# Patient Record
Sex: Female | Born: 1985 | Race: White | Hispanic: Yes | Marital: Married | Smoking: Former smoker
Health system: Southern US, Community
[De-identification: ages and names within clinical notes are randomized; demographics above are authoritative.]

## PROBLEM LIST (undated history)

## (undated) DIAGNOSIS — L309 Dermatitis, unspecified: Secondary | ICD-10-CM

## (undated) DIAGNOSIS — E282 Polycystic ovarian syndrome: Secondary | ICD-10-CM

## (undated) DIAGNOSIS — F329 Major depressive disorder, single episode, unspecified: Secondary | ICD-10-CM

## (undated) DIAGNOSIS — J45909 Unspecified asthma, uncomplicated: Secondary | ICD-10-CM

## (undated) DIAGNOSIS — F32A Depression, unspecified: Secondary | ICD-10-CM

## (undated) DIAGNOSIS — N3010 Interstitial cystitis (chronic) without hematuria: Secondary | ICD-10-CM

## (undated) DIAGNOSIS — F419 Anxiety disorder, unspecified: Secondary | ICD-10-CM

## (undated) HISTORY — DX: Dermatitis, unspecified: L30.9

## (undated) HISTORY — DX: Major depressive disorder, single episode, unspecified: F32.9

## (undated) HISTORY — DX: Depression, unspecified: F32.A

## (undated) HISTORY — DX: Interstitial cystitis (chronic) without hematuria: N30.10

## (undated) HISTORY — PX: WISDOM TOOTH EXTRACTION: SHX21

## (undated) HISTORY — DX: Polycystic ovarian syndrome: E28.2

## (undated) HISTORY — DX: Unspecified asthma, uncomplicated: J45.909

## (undated) HISTORY — DX: Anxiety disorder, unspecified: F41.9

---
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
## 2010-04-10 ENCOUNTER — Ambulatory Visit: Payer: Self-pay | Admitting: Unknown Physician Specialty

## 2012-02-18 DIAGNOSIS — F329 Major depressive disorder, single episode, unspecified: Secondary | ICD-10-CM | POA: Insufficient documentation

## 2013-02-07 ENCOUNTER — Other Ambulatory Visit: Payer: Self-pay | Admitting: Otolaryngology

## 2013-02-09 ENCOUNTER — Ambulatory Visit: Payer: Self-pay | Admitting: Otolaryngology

## 2013-02-24 ENCOUNTER — Ambulatory Visit: Payer: Self-pay | Admitting: Otolaryngology

## 2013-02-24 HISTORY — PX: OTHER SURGICAL HISTORY: SHX169

## 2013-02-25 LAB — PATHOLOGY REPORT

## 2015-02-26 DIAGNOSIS — L309 Dermatitis, unspecified: Secondary | ICD-10-CM | POA: Insufficient documentation

## 2015-02-26 DIAGNOSIS — E282 Polycystic ovarian syndrome: Secondary | ICD-10-CM | POA: Insufficient documentation

## 2015-02-26 DIAGNOSIS — J4520 Mild intermittent asthma, uncomplicated: Secondary | ICD-10-CM | POA: Insufficient documentation

## 2015-02-26 DIAGNOSIS — F419 Anxiety disorder, unspecified: Secondary | ICD-10-CM | POA: Insufficient documentation

## 2015-02-26 DIAGNOSIS — N390 Urinary tract infection, site not specified: Secondary | ICD-10-CM | POA: Insufficient documentation

## 2015-02-26 DIAGNOSIS — J309 Allergic rhinitis, unspecified: Secondary | ICD-10-CM | POA: Insufficient documentation

## 2015-03-30 NOTE — Op Note (Signed)
PATIENT NAME:  Kaitlin [name], Kaitlin [name] MR#:  [id number] DATE OF BIRTH:  September 18, 1986  DATE OF PROCEDURE:  02/25/2013  PREOPERATIVE DIAGNOSIS: Cervical lymphadenopathy with radiographically and clinically enlarged lymph nodes in the submental triangle (with concern for lymphoma).   POSTOPERATIVE DIAGNOSIS: Cervical lymphadenopathy with radiographically and clinically enlarged lymph nodes in the submental triangle (with concern for lymphoma).   PROCEDURE:  Excision, deep cervical lymph nodes (submental).   DESCRIPTION OF PROCEDURE:  The patient was placed in the supine position on the operating room table. After general LMA anesthesia had been induced, the patient was placed on a shoulder roll and neck extended. A horizontal skin crease was then chosen and locally anesthetized, prepped and draped in the usual fashion. A 15-blade was used to make an incision through the skin, and meticulous dissection was carried out down to the submental lymph nodes. Care was taken to avoid injury to the marginal mandibular nerves and the hypoglossal nerves bilaterally. Two large approximately 1.5 x 1.5 cm lymph nodes were taken, each from just off of the parasagittal midline. Meticulous hemostasis was achieved. The wound was closed over a TLS-type drain, dressed with bacitracin. The patient was returned to anesthesia, allowed to emerge from anesthesia in the operating room, and taken to the recovery room in stable condition. There were no complications.   ESTIMATED BLOOD LOSS: Approximately 10 mL.    ____________________________ J. Gertie BaronMadison Braxtyn Bojarski, MD jmc:dmm D: 02/25/2013 08:33:32 ET T: 02/25/2013 09:41:19 ET JOB#: [id number]  cc: Kaitlin BarefootJ. Kaitlin Quinn Quam, MD, <Dictator> Kaitlin CoppJMADISON Keonta Alsip MD ELECTRONICALLY SIGNED 02/28/2013 7:48

## 2016-07-18 ENCOUNTER — Other Ambulatory Visit: Payer: Self-pay | Admitting: Obstetrics and Gynecology

## 2016-07-18 DIAGNOSIS — N979 Female infertility, unspecified: Secondary | ICD-10-CM

## 2016-07-24 ENCOUNTER — Ambulatory Visit
Admission: RE | Admit: 2016-07-24 | Discharge: 2016-07-24 | Disposition: A | Discharge status: Home / Self Care | Payer: BC Managed Care – PPO | Source: Ambulatory Visit | Attending: Obstetrics and Gynecology | Admitting: Obstetrics and Gynecology

## 2016-07-24 DIAGNOSIS — N979 Female infertility, unspecified: Secondary | ICD-10-CM | POA: Insufficient documentation

## 2016-07-24 MED ORDER — IOPAMIDOL (ISOVUE-370) INJECTION 76%
3.0000 mL | Freq: Once | INTRAVENOUS | Status: AC | PRN
  Administered 2016-07-24: 3 mL

## 2016-07-24 NOTE — Procedures (Signed)
Patient confirmed no shellfish or iodine allergies.  Sterile speculum placed, cervix visualized, cleaned with betadine solution.  HSG catheter placed without difficulty.  3mL of iosview 370 injected with normal cavity contour and bilateral spill of contrast.  Normal cavity contour.  Patient tolerated procedure well without complications

## 2016-10-01 LAB — OB RESULTS CONSOLE HIV ANTIBODY (ROUTINE TESTING): HIV: NONREACTIVE

## 2016-10-01 LAB — OB RESULTS CONSOLE RUBELLA ANTIBODY, IGM: Rubella: IMMUNE

## 2016-10-01 LAB — HM PAP SMEAR

## 2016-10-01 LAB — OB RESULTS CONSOLE ABO/RH: RH Type: POSITIVE

## 2016-10-01 LAB — OB RESULTS CONSOLE HGB/HCT, BLOOD
HEMATOCRIT: 42 %
Hemoglobin: 14 g/dL

## 2016-10-01 LAB — OB RESULTS CONSOLE GC/CHLAMYDIA
Chlamydia: NEGATIVE
Gonorrhea: NEGATIVE

## 2016-10-01 LAB — OB RESULTS CONSOLE VARICELLA ZOSTER ANTIBODY, IGG: Varicella: IMMUNE

## 2016-10-01 LAB — OB RESULTS CONSOLE PLATELET COUNT: Platelets: 308 10*3/uL

## 2016-10-01 LAB — OB RESULTS CONSOLE HEPATITIS B SURFACE ANTIGEN: HEP B S AG: NEGATIVE

## 2016-10-01 LAB — OB RESULTS CONSOLE RPR: RPR: NONREACTIVE

## 2016-12-02 IMAGING — RF DG HYSTEROGRAM
1 series · 1 of 1 positions shown · IV contrast (isovue)
Comparison: None.

CLINICAL DATA: Infertility.

EXAM:
HYSTEROSALPINGOGRAM
TECHNIQUE: Following cleansing of the cervix and vagina with Betadine solution,
a hysterosalpingogram was performed using a 5-French
hysterosalpingogram catheter and Isovue 300 contrast. The patient
tolerated the examination without difficulty.

[Series 1: cp_standard · 0.17mm/px · 1 of 1 slices shown]
[im 1/1]
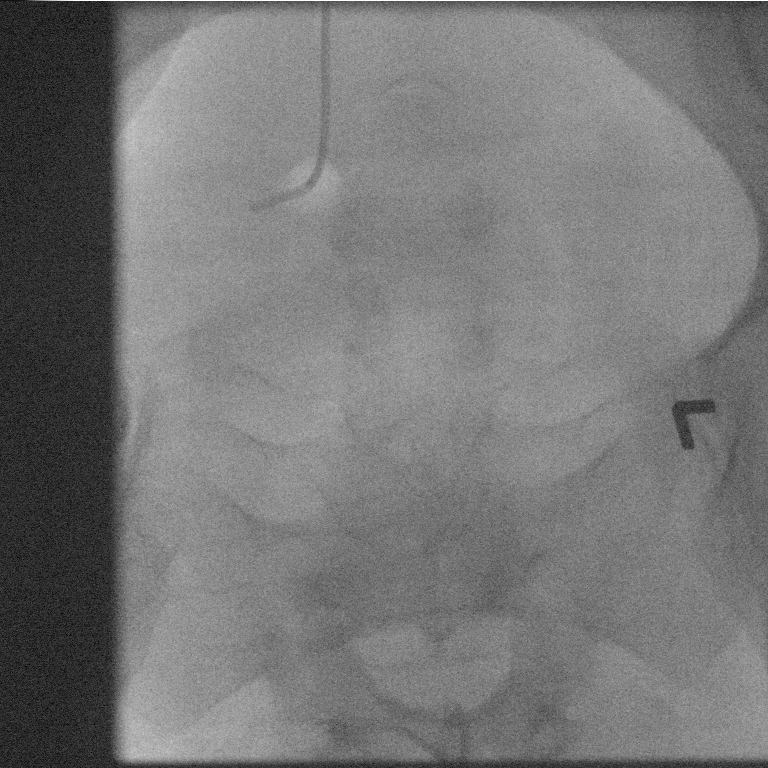

[1 of 1 positions shown; findings below may reference images not displayed]

FLUOROSCOPY TIME:  Radiation Exposure Index (as provided by the
fluoroscopic device): 7.2 mGy
FINDINGS: Dr. Derick inserted a catheter into the cervix. 3 mL contrast was
then instilled into the uterus using intermittent fluoroscopy.

The uterus demonstrates normal contour, orientation, and morphology
with no filling defects. The fallopian tubes fill bilaterally
demonstrating no filling defects or stenoses. There was free
spillage of contrast from the fallopian tubes into the
intraperitoneal cavity.
IMPRESSION: Normal HSG.

## 2016-12-08 NOTE — L&D Delivery Note (Addendum)
Delivery Note At 12:49 AM a viable female was delivered via Vaginal, Spontaneous Delivery (Presentation: ROA).  APGAR: pending; weight 5lbs 8oz.   Placenta status: spontaneous, intact.  Cord: 3VC, with umbilical cord cyst. Nuchal arm 1 reduced after delivery of the head.  The following complications: [redacted] weeks gestation, ruptured, precipitous delivery shortly after arrival, received 1 dose of ampicillin, no steroids, arrived infant limp at delivery, cord cut (did not get delayed clamping).  Cord pH: pending  Anesthesia:  none Episiotomy:  none Lacerations:  none Suture Repair: N/A Est. Blood Loss (mL):  300mL  Mom to postpartum.  Baby to NICU.  Kaitlin [name] 04/20/2017, 1:01 AM

## 2017-01-23 ENCOUNTER — Ambulatory Visit: Payer: BC Managed Care – PPO

## 2017-01-23 ENCOUNTER — Encounter: Payer: Self-pay | Admitting: Podiatry

## 2017-01-23 ENCOUNTER — Ambulatory Visit (INDEPENDENT_AMBULATORY_CARE_PROVIDER_SITE_OTHER): Payer: BC Managed Care – PPO | Admitting: Podiatry

## 2017-01-23 VITALS — BP 122/76 | HR 81 | Resp 16

## 2017-01-23 DIAGNOSIS — M7661 Achilles tendinitis, right leg: Secondary | ICD-10-CM | POA: Diagnosis not present

## 2017-01-23 DIAGNOSIS — R52 Pain, unspecified: Secondary | ICD-10-CM

## 2017-01-23 NOTE — Progress Notes (Signed)
   Subjective:    Patient ID: Kaitlin [name], female    DOB: 06/20/1986, 31 y.o.   MRN: [id number]  HPI    Review of Systems  All other systems reviewed and are negative.      Objective:   Physical Exam        Assessment & Plan:

## 2017-01-23 NOTE — Progress Notes (Signed)
   Subjective:    Patient ID: Kaitlin PuttStephanie R Mouser, female    DOB: Oct 16, 1986, 31 y.o.   MRN: [id number]  HPI    Review of Systems     Objective:   Physical Exam        Assessment & Plan:

## 2017-01-25 NOTE — Progress Notes (Signed)
Patient ID: Kaitlin PuttStephanie R Bucy, female   DOB: Jan 28, 1986, 31 y.o.   MRN: [id number]   Subjective:  31 year old female who is currently [redacted] weeks pregnant presents today for right Achilles tendon pain which is been going on for approximately one week now. Patient states that approximately a week ago she began to have cramps in her leg at night time. She went to stretch and flex her foot and she's dates that it significantly increased her pain. Patient has had pain ever since. Currently patient states that she only has pain when flexing the foot up and stretching of the Achilles tendon.    Objective/Physical Exam General: The patient is alert and oriented x3 in no acute distress.  Dermatology: Skin is warm, dry and supple bilateral lower extremities. Negative for open lesions or macerations.  Vascular: Palpable pedal pulses bilaterally. No edema or erythema noted. Capillary refill within normal limits.  Neurological: Epicritic and protective threshold grossly intact bilaterally.   Musculoskeletal Exam: Pain on palpation to the insertion of the Achilles tendon right lower extremity. Pain also with dorsiflexion of the right ankle consistent with an insertional Achilles tendinitis right lower extremity. Range of motion within normal limits to all pedal and ankle joints bilateral. Muscle strength 5/5 in all groups bilateral.    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 #2 today we discussed conservative modalities including appropriate shoe gear with a heel lift. Recommend going to make a sports to find a good pair of walking shoes with a slight heel lift #3 compression anklet with a gel heel pad provided for the patient #4 return to clinic when necessary   Felecia ShellingBrent M. Evans, DPM [facility]  Dr. Felecia ShellingBrent M. Evans, DPM    [address]                                        [place], [place] [address]                  Office [phone]) [phone]  Fax [phone]

## 2017-02-05 ENCOUNTER — Encounter: Payer: Self-pay | Admitting: Obstetrics and Gynecology

## 2017-02-05 ENCOUNTER — Other Ambulatory Visit: Payer: Self-pay | Admitting: Obstetrics and Gynecology

## 2017-02-05 DIAGNOSIS — IMO0002 Reserved for concepts with insufficient information to code with codable children: Secondary | ICD-10-CM

## 2017-02-05 DIAGNOSIS — Z0489 Encounter for examination and observation for other specified reasons: Secondary | ICD-10-CM

## 2017-02-06 ENCOUNTER — Ambulatory Visit (INDEPENDENT_AMBULATORY_CARE_PROVIDER_SITE_OTHER): Payer: BC Managed Care – PPO | Admitting: Podiatry

## 2017-02-06 DIAGNOSIS — M7661 Achilles tendinitis, right leg: Secondary | ICD-10-CM

## 2017-02-11 ENCOUNTER — Telehealth: Payer: Self-pay | Admitting: *Deleted

## 2017-02-11 MED ORDER — NONFORMULARY OR COMPOUNDED ITEM: 2 refills | Status: DC

## 2017-02-11 NOTE — Telephone Encounter (Signed)
Yes please. - Dr. Jermiya Reichl

## 2017-02-11 NOTE — Telephone Encounter (Addendum)
Pt states she was to get rx for achilles tendonitis cream [place] [facility]. I spoke with Clydene LamingMelissa - [facility], she states there is no order for this pt. Dr. Logan BoresEvans ordered [facility]. [facility] and informed pt of the delay and apologized gave [facility] [phone].

## 2017-02-11 NOTE — Progress Notes (Signed)
other

## 2017-02-12 ENCOUNTER — Ambulatory Visit (INDEPENDENT_AMBULATORY_CARE_PROVIDER_SITE_OTHER): Payer: BC Managed Care – PPO

## 2017-02-12 ENCOUNTER — Ambulatory Visit (INDEPENDENT_AMBULATORY_CARE_PROVIDER_SITE_OTHER): Payer: BC Managed Care – PPO | Admitting: Obstetrics & Gynecology

## 2017-02-12 VITALS — BP 118/70 | HR 77 | Wt 166.0 lb

## 2017-02-12 DIAGNOSIS — Z3A24 24 weeks gestation of pregnancy: Secondary | ICD-10-CM

## 2017-02-12 DIAGNOSIS — N3000 Acute cystitis without hematuria: Secondary | ICD-10-CM

## 2017-02-12 DIAGNOSIS — IMO0002 Reserved for concepts with insufficient information to code with codable children: Secondary | ICD-10-CM

## 2017-02-12 DIAGNOSIS — Z3A28 28 weeks gestation of pregnancy: Secondary | ICD-10-CM

## 2017-02-12 DIAGNOSIS — Z362 Encounter for other antenatal screening follow-up: Secondary | ICD-10-CM | POA: Diagnosis not present

## 2017-02-12 DIAGNOSIS — R300 Dysuria: Secondary | ICD-10-CM | POA: Diagnosis not present

## 2017-02-12 DIAGNOSIS — Z0489 Encounter for examination and observation for other specified reasons: Secondary | ICD-10-CM

## 2017-02-12 DIAGNOSIS — Z3490 Encounter for supervision of normal pregnancy, unspecified, unspecified trimester: Secondary | ICD-10-CM

## 2017-02-12 LAB — POCT URINALYSIS DIPSTICK
Bilirubin, UA: NEGATIVE
Glucose, UA: NEGATIVE
Ketones, UA: NEGATIVE
NITRITE UA: NEGATIVE
PH UA: 5
PROTEIN UA: NEGATIVE
RBC UA: NEGATIVE
Spec Grav, UA: 1.01
Urobilinogen, UA: 1

## 2017-02-12 MED ORDER — NITROFURANTOIN MONOHYD MACRO 100 MG PO CAPS: 100.0000 mg | ORAL_CAPSULE | Freq: Two times a day (BID) | ORAL | 1 refills | Status: DC

## 2017-02-12 NOTE — Progress Notes (Signed)
Vag d/c, irritation, dysuria.  See note. PNV, US discussed, glucola nv.

## 2017-02-12 NOTE — Progress Notes (Signed)
HPI:      Ms. Elmo PuttStephanie R Kincaid is a 31 y.o. G1P0 who LMP was Patient's last menstrual period was 07/16/2016., presents today for a problem visit.  She complains of:  Vaginitis: Patient complains of an abnormal vaginal discharge for 3 days. (always has some d/c while pregnant, other sx's worse last 3 days). Vaginal symptoms include local irritation.Vulvar symptoms include local irritation.STI Risk: Very low risk of STD exposure.  Discharge described as: normal and physiologic.Other associated symptoms: none.  PMHx: She  has no past medical history on file. Also,  has no past surgical history on file., family history is not on file.,  reports that she has quit smoking. She has never used smokeless tobacco. She reports that she does not drink alcohol or use drugs.  She has a current medication list which includes the following prescription(s): NONFORMULARY OR COMPOUNDED ITEM and prenatal vitamin w/fe, fa. Also, is allergic to sulfa antibiotics; eggs or egg-derived products; latex; and metronidazole.  Review of Systems  Constitutional: Negative for chills, fever and malaise/fatigue.  HENT: Negative for congestion, sinus pain and sore throat.   Eyes: Negative for blurred vision and pain.  Respiratory: Negative for cough and wheezing.   Cardiovascular: Negative for chest pain and leg swelling.  Gastrointestinal: Negative for abdominal pain, constipation, diarrhea, heartburn, nausea and vomiting.  Genitourinary: Negative for dysuria, frequency, hematuria and urgency.  Musculoskeletal: Negative for back pain, joint pain, myalgias and neck pain.  Skin: Negative for itching and rash.  Neurological: Negative for dizziness, tremors and weakness.  Endo/Heme/Allergies: Does not bruise/bleed easily.  Psychiatric/Behavioral: Negative for depression. The patient is not nervous/anxious and does not have insomnia.     Objective: BP 118/70   Pulse 77   Wt 166 lb (75.3 kg)   LMP 07/16/2016  Physical Exam   Constitutional: She is oriented to person, place, and time. She appears well-developed and well-nourished. No distress.  Genitourinary: Vagina normal. Pelvic exam was performed with patient supine. There is no rash, tenderness or lesion on the right labia. There is no rash, tenderness or lesion on the left labia. No erythema or bleeding in the vagina.  Abdominal: Soft. She exhibits no distension. There is no tenderness.  Musculoskeletal: Normal range of motion.  Neurological: She is alert and oriented to person, place, and time. No cranial nerve deficit.  Skin: Skin is warm and dry.  Psychiatric: She has a normal mood and affect.   Results for orders placed or performed in visit on 02/12/17  POCT urinalysis dipstick  Result Value Ref Range   Color, UA yellow    Clarity, UA     Glucose, UA neg    Bilirubin, UA neg    Ketones, UA neg    Spec Grav, UA 1.010    Blood, UA neg    pH, UA 5.0    Protein, UA neg    Urobilinogen, UA 1.0    Nitrite, UA neg    Leukocytes, UA moderate (2+) (A) Negative    ASSESSMENT/PLAN:    Problem List Items Addressed This Visit      Other   Encounter for supervision of low-risk pregnancy, antepartum   Relevant Orders   NuSwab Vaginitis Plus (VG+)    Likely vaginitis vs irritation of pregnancy. UTI. Treat w Macrobid.   Annamarie MajorPaul Harris, MD, Merlinda FrederickFACOG [facility], [facility] 02/12/2017  2:18 PM

## 2017-02-17 LAB — NUSWAB VAGINITIS PLUS (VG+)
CANDIDA GLABRATA, NAA: NEGATIVE
Candida albicans, NAA: NEGATIVE
Chlamydia trachomatis, NAA: NEGATIVE
Neisseria gonorrhoeae, NAA: NEGATIVE
TRICH VAG BY NAA: NEGATIVE

## 2017-02-22 NOTE — Progress Notes (Signed)
Patient ID: Kaitlin [name], female   DOB: 03/13/1986, 31 y.o.   MRN: [id number]   Subjective:  31 year old female who is approximately [redacted] weeks pregnant presents today for follow-up evaluation of Achilles tendinitis pain which is been going on for approximately 3 weeks now. Patient has been experiencing cramps in her leg at nighttime. Patient states that the pain is worse today. There's been no alleviation of symptoms the patient despite appropriate shoe gear and good walking shoes with a slight heel lift.    Objective/Physical Exam General: The patient is alert and oriented x3 in no acute distress.  Dermatology: Skin is warm, dry and supple bilateral lower extremities. Negative for open lesions or macerations.  Vascular: Palpable pedal pulses bilaterally. No edema or erythema noted. Capillary refill within normal limits.  Neurological: Epicritic and protective threshold grossly intact bilaterally.   Musculoskeletal Exam: Pain on palpation to the insertion of the Achilles tendon right lower extremity. Pain also with dorsiflexion of the right ankle consistent with an insertional Achilles tendinitis right lower extremity. Range of motion within normal limits to all pedal and ankle joints bilateral. Muscle strength 5/5 in all groups bilateral.    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 #2 continue good walking shoes with a slightly hill left #3 continue compression anklet with a gel heel pad #4 immobilization cam boot provided with compression anklet without hill left Exline #5 prescription [facility]inflammatory pain cream [facility]. I personally called the pharmacy to verify this topical medication is safe during pregnancy, the pharmacist confirmed that it is a safe modality of treatment during pregnancy. #5 return to clinic when necessary  Felecia ShellingBrent M. Evans, DPM [facility]  Dr. Felecia ShellingBrent M.  Evans, DPM    [address]                                        [place], [place] [address]                Office [phone]) [phone]  Fax [phone]) [phone]

## 2017-03-10 ENCOUNTER — Other Ambulatory Visit: Payer: BC Managed Care – PPO

## 2017-03-10 ENCOUNTER — Ambulatory Visit (INDEPENDENT_AMBULATORY_CARE_PROVIDER_SITE_OTHER): Payer: BC Managed Care – PPO | Admitting: Podiatry

## 2017-03-10 ENCOUNTER — Ambulatory Visit (INDEPENDENT_AMBULATORY_CARE_PROVIDER_SITE_OTHER): Payer: BC Managed Care – PPO | Admitting: Obstetrics and Gynecology

## 2017-03-10 ENCOUNTER — Encounter: Payer: Self-pay | Admitting: Podiatry

## 2017-03-10 VITALS — BP 110/66 | Wt 172.0 lb

## 2017-03-10 DIAGNOSIS — Z3A28 28 weeks gestation of pregnancy: Secondary | ICD-10-CM

## 2017-03-10 DIAGNOSIS — M7661 Achilles tendinitis, right leg: Secondary | ICD-10-CM

## 2017-03-10 DIAGNOSIS — Z3403 Encounter for supervision of normal first pregnancy, third trimester: Secondary | ICD-10-CM

## 2017-03-10 DIAGNOSIS — Z113 Encounter for screening for infections with a predominantly sexual mode of transmission: Secondary | ICD-10-CM

## 2017-03-10 NOTE — Progress Notes (Signed)
    Routine Prenatal Care Visit  Subjective  Fetal Movement? yes Contractions? no Leaking Fluid? no Vaginal Bleeding? no  Objective   Vitals:   03/10/17 0835  BP: 110/66    @ Urine dipstick shows negative for all components.  General: NAD Pumonary: no increased work of breathing Abdomen: gravid, non-tender, fundal height 29, fetal heart tones 145BPM Extremities: no edema Psychiatric: mood appropriate, affect full   Assessment   31 y.o. G1P0 at [redacted]w[redacted]d by  05/30/2017, Date entered prior to episode creation presenting for routine prenatal visit  Pregnancy #1 Problems (from 09/06/16 to present)    No problems associated with this episode.       Plan   Problem List Items Addressed This Visit    None    Visit Diagnoses    [redacted] weeks gestation of pregnancy    -  Primary   Relevant Orders   28 Week RH+Panel   Encounter for supervision of normal first pregnancy in third trimester       Relevant Orders   28 Week RH+Panel   Screen for STD (sexually transmitted disease)       Relevant Orders   28 Week RH+Panel

## 2017-03-10 NOTE — Patient Instructions (Signed)
Third Trimester of Pregnancy The third trimester is from week 28 through week 40 (months 7 through 9). The third trimester is a time when the unborn baby (fetus) is growing rapidly. At the end of the ninth month, the fetus is about 20 inches in length and weighs 6-10 pounds. Body changes during your third trimester Your body will continue to go through many changes during pregnancy. The changes vary from woman to woman. During the third trimester:  Your weight will continue to increase. You can expect to gain 25-35 pounds (11-16 kg) by the end of the pregnancy.  You may begin to get stretch marks on your hips, abdomen, and breasts.  You may urinate more often because the fetus is moving lower into your pelvis and pressing on your bladder.  You may develop or continue to have heartburn. This is caused by increased hormones that slow down muscles in the digestive tract.  You may develop or continue to have constipation because increased hormones slow digestion and cause the muscles that push waste through your intestines to relax.  You may develop hemorrhoids. These are swollen veins (varicose veins) in the rectum that can itch or be painful.  You may develop swollen, bulging veins (varicose veins) in your legs.  You may have increased body aches in the pelvis, back, or thighs. This is due to weight gain and increased hormones that are relaxing your joints.  You may have changes in your hair. These can include thickening of your hair, rapid growth, and changes in texture. Some women also have hair loss during or after pregnancy, or hair that feels dry or thin. Your hair will most likely return to normal after your baby is born.  Your breasts will continue to grow and they will continue to become tender. A yellow fluid (colostrum) may leak from your breasts. This is the first milk you are producing for your baby.  Your belly button may stick out.  You may notice more swelling in your hands,  face, or ankles.  You may have increased tingling or numbness in your hands, arms, and legs. The skin on your belly may also feel numb.  You may feel short of breath because of your expanding uterus.  You may have more problems sleeping. This can be caused by the size of your belly, increased need to urinate, and an increase in your body's metabolism.  You may notice the fetus "dropping," or moving lower in your abdomen (lightening).  You may have increased vaginal discharge.  You may notice your joints feel loose and you may have pain around your pelvic bone.  What to expect at prenatal visits You will have prenatal exams every 2 weeks until week 36. Then you will have weekly prenatal exams. During a routine prenatal visit:  You will be weighed to make sure you and the baby are growing normally.  Your blood pressure will be taken.  Your abdomen will be measured to track your baby's growth.  The fetal heartbeat will be listened to.  Any test results from the previous visit will be discussed.  You may have a cervical check near your due date to see if your cervix has softened or thinned (effaced).  You will be tested for Group B streptococcus. This happens between 35 and 37 weeks.  Your health care provider may ask you:  What your birth plan is.  How you are feeling.  If you are feeling the baby move.  If you have had   any abnormal symptoms, such as leaking fluid, bleeding, severe headaches, or abdominal cramping.  If you are using any tobacco products, including cigarettes, chewing tobacco, and electronic cigarettes.  If you have any questions.  Other tests or screenings that may be performed during your third trimester include:  Blood tests that check for low iron levels (anemia).  Fetal testing to check the health, activity level, and growth of the fetus. Testing is done if you have certain medical conditions or if there are problems during the  pregnancy.  Nonstress test (NST). This test checks the health of your baby to make sure there are no signs of problems, such as the baby not getting enough oxygen. During this test, a belt is placed around your belly. The baby is made to move, and its heart rate is monitored during movement.  What is false labor? False labor is a condition in which you feel small, irregular tightenings of the muscles in the womb (contractions) that usually go away with rest, changing position, or drinking water. These are called Braxton Hicks contractions. Contractions may last for hours, days, or even weeks before true labor sets in. If contractions come at regular intervals, become more frequent, increase in intensity, or become painful, you should see your health care provider. What are the signs of labor?  Abdominal cramps.  Regular contractions that start at 10 minutes apart and become stronger and more frequent with time.  Contractions that start on the top of the uterus and spread down to the lower abdomen and back.  Increased pelvic pressure and dull back pain.  A watery or bloody mucus discharge that comes from the vagina.  Leaking of amniotic fluid. This is also known as your "water breaking." It could be a slow trickle or a gush. Let your health care provider know if it has a color or strange odor. If you have any of these signs, call your health care provider right away, even if it is before your due date. Follow these instructions at home: Medicines  Follow your health care provider's instructions regarding medicine use. Specific medicines may be either safe or unsafe to take during pregnancy.  Take a prenatal vitamin that contains at least 600 micrograms (mcg) of folic acid.  If you develop constipation, try taking a stool softener if your health care provider approves. Eating and drinking  Eat a balanced diet that includes fresh fruits and vegetables, whole grains, good sources of protein  such as meat, eggs, or tofu, and low-fat dairy. Your health care provider will help you determine the amount of weight gain that is right for you.  Avoid raw meat and uncooked cheese. These carry germs that can cause birth defects in the baby.  If you have low calcium intake from food, talk to your health care provider about whether you should take a daily calcium supplement.  Eat four or five small meals rather than three large meals a day.  Limit foods that are high in fat and processed sugars, such as fried and sweet foods.  To prevent constipation: ? Drink enough fluid to keep your urine clear or pale yellow. ? Eat foods that are high in fiber, such as fresh fruits and vegetables, whole grains, and beans. Activity  Exercise only as directed by your health care provider. Most women can continue their usual exercise routine during pregnancy. Try to exercise for 30 minutes at least 5 days a week. Stop exercising if you experience uterine contractions.  Avoid heavy   lifting.  Do not exercise in extreme heat or humidity, or at high altitudes.  Wear low-heel, comfortable shoes.  Practice good posture.  You may continue to have sex unless your health care provider tells you otherwise. Relieving pain and discomfort  Take frequent breaks and rest with your legs elevated if you have leg cramps or low back pain.  Take warm sitz baths to soothe any pain or discomfort caused by hemorrhoids. Use hemorrhoid cream if your health care provider approves.  Wear a good support bra to prevent discomfort from breast tenderness.  If you develop varicose veins: ? Wear support pantyhose or compression stockings as told by your healthcare provider. ? Elevate your feet for 15 minutes, 3-4 times a day. Prenatal care  Write down your questions. Take them to your prenatal visits.  Keep all your prenatal visits as told by your health care provider. This is important. Safety  Wear your seat belt at  all times when driving.  Make a list of emergency phone numbers, including numbers for family, friends, the hospital, and police and fire departments. General instructions  Avoid cat litter boxes and soil used by cats. These carry germs that can cause birth defects in the baby. If you have a cat, ask someone to clean the litter box for you.  Do not travel far distances unless it is absolutely necessary and only with the approval of your health care provider.  Do not use hot tubs, steam rooms, or saunas.  Do not drink alcohol.  Do not use any products that contain nicotine or tobacco, such as cigarettes and e-cigarettes. If you need help quitting, ask your health care provider.  Do not use any medicinal herbs or unprescribed drugs. These chemicals affect the formation and growth of the baby.  Do not douche or use tampons or scented sanitary pads.  Do not cross your legs for long periods of time.  To prepare for the arrival of your baby: ? Take prenatal classes to understand, practice, and ask questions about labor and delivery. ? Make a trial run to the hospital. ? Visit the hospital and tour the maternity area. ? Arrange for maternity or paternity leave through employers. ? Arrange for family and friends to take care of pets while you are in the hospital. ? Purchase a rear-facing car seat and make sure you know how to install it in your car. ? Pack your hospital bag. ? Prepare the baby's nursery. Make sure to remove all pillows and stuffed animals from the baby's crib to prevent suffocation.  Visit your dentist if you have not gone during your pregnancy. Use a soft toothbrush to brush your teeth and be gentle when you floss. Contact a health care provider if:  You are unsure if you are in labor or if your water has broken.  You become dizzy.  You have mild pelvic cramps, pelvic pressure, or nagging pain in your abdominal area.  You have lower back pain.  You have persistent  nausea, vomiting, or diarrhea.  You have an unusual or bad smelling vaginal discharge.  You have pain when you urinate. Get help right away if:  Your water breaks before 37 weeks.  You have regular contractions less than 5 minutes apart before 37 weeks.  You have a fever.  You are leaking fluid from your vagina.  You have spotting or bleeding from your vagina.  You have severe abdominal pain or cramping.  You have rapid weight loss or weight gain.    You have shortness of breath with chest pain.  You notice sudden or extreme swelling of your face, hands, ankles, feet, or legs.  Your baby makes fewer than 10 movements in 2 hours.  You have severe headaches that do not go away when you take medicine.  You have vision changes. Summary  The third trimester is from week 28 through week 40, months 7 through 9. The third trimester is a time when the unborn baby (fetus) is growing rapidly.  During the third trimester, your discomfort may increase as you and your baby continue to gain weight. You may have abdominal, leg, and back pain, sleeping problems, and an increased need to urinate.  During the third trimester your breasts will keep growing and they will continue to become tender. A yellow fluid (colostrum) may leak from your breasts. This is the first milk you are producing for your baby.  False labor is a condition in which you feel small, irregular tightenings of the muscles in the womb (contractions) that eventually go away. These are called Braxton Hicks contractions. Contractions may last for hours, days, or even weeks before true labor sets in.  Signs of labor can include: abdominal cramps; regular contractions that start at 10 minutes apart and become stronger and more frequent with time; watery or bloody mucus discharge that comes from the vagina; increased pelvic pressure and dull back pain; and leaking of amniotic fluid. This information is not intended to replace advice  given to you by your health care provider. Make sure you discuss any questions you have with your health care provider. Document Released: 11/18/2001 Document Revised: 05/01/2016 Document Reviewed: 01/25/2013 Elsevier Interactive Patient Education  2017 [employer] Inc[employer]  

## 2017-03-10 NOTE — Progress Notes (Signed)
28 week labs today.  

## 2017-03-11 ENCOUNTER — Encounter: Payer: Self-pay | Admitting: Obstetrics and Gynecology

## 2017-03-11 LAB — 28 WEEK RH+PANEL
Basophils Absolute: 0 10*3/uL (ref 0.0–0.2)
Basos: 0 %
EOS (ABSOLUTE): 0.1 10*3/uL (ref 0.0–0.4)
EOS: 1 %
Gestational Diabetes Screen: 117 mg/dL (ref 65–139)
HEMATOCRIT: 40 % (ref 34.0–46.6)
HEMOGLOBIN: 13.8 g/dL (ref 11.1–15.9)
HIV SCREEN 4TH GENERATION: NONREACTIVE
Immature Grans (Abs): 0.1 10*3/uL (ref 0.0–0.1)
Immature Granulocytes: 1 %
LYMPHS ABS: 2.4 10*3/uL (ref 0.7–3.1)
Lymphs: 21 %
MCH: 30.8 pg (ref 26.6–33.0)
MCHC: 34.5 g/dL (ref 31.5–35.7)
MCV: 89 fL (ref 79–97)
MONOCYTES: 5 %
Monocytes Absolute: 0.6 10*3/uL (ref 0.1–0.9)
Neutrophils Absolute: 8.2 10*3/uL — ABNORMAL HIGH (ref 1.4–7.0)
Neutrophils: 72 %
Platelets: 272 10*3/uL (ref 150–379)
RBC: 4.48 x10E6/uL (ref 3.77–5.28)
RDW: 13.3 % (ref 12.3–15.4)
RPR Ser Ql: NONREACTIVE
WBC: 11.3 10*3/uL — AB (ref 3.4–10.8)

## 2017-03-12 DIAGNOSIS — N3010 Interstitial cystitis (chronic) without hematuria: Secondary | ICD-10-CM | POA: Insufficient documentation

## 2017-03-12 NOTE — Progress Notes (Signed)
Patient ID: Kaitlin [name], female   DOB: [birth year], 31 y.o.   MRN: [id number]   Subjective:  Patient presents today for follow-up evaluation insertional Achilles tendinitis to the right lower extremity. Patient states she is approximately 99% improved. She states that she feels much better. Patient has not much pain at all. Patient states that the gel pad, compression, anti-inflammatory pain cream all help.    Objective/Physical Exam General: The patient is alert and oriented x3 in no acute distress.  Dermatology: Skin is warm, dry and supple bilateral lower extremities. Negative for open lesions or macerations.  Vascular: Palpable pedal pulses bilaterally. No edema or erythema noted. Capillary refill within normal limits.  Neurological: Epicritic and protective threshold grossly intact bilaterally.   Musculoskeletal Exam: Negative for Pain on palpation to the insertion of the Achilles tendon right lower extremity. Pain also with dorsiflexion of the right ankle consistent with an insertional Achilles tendinitis right lower extremity. Range of motion within normal limits to all pedal and ankle joints bilateral. Muscle strength 5/5 in all groups bilateral.    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 #2 continue good walking shoes with a slightly hill left #3 continue compression anklet with a gel heel pad #4 discontinue immobilization cam boot. #5 return to clinic when necessary  Felecia Shelling, DPM [facility]  Dr. Felecia Shelling, DPM    [address]                                        [place], [place] [address]                Office [phone]  Fax [phone]

## 2017-03-23 ENCOUNTER — Telehealth: Payer: Self-pay

## 2017-03-23 NOTE — Telephone Encounter (Signed)
Pt called.  She fell in her driveway yesterday ending up on her belly.  When she fell her belly was hard and felt weird.  She is having good fetal movement since.  She is okay except for aches and pains.  Does she need to come in for monitoring or wait for appt tomorrow.  Pt aware per AMS okay to wait for appt tomorrow since no ctxs, vag. Bleeding, good FM.

## 2017-03-24 ENCOUNTER — Ambulatory Visit (INDEPENDENT_AMBULATORY_CARE_PROVIDER_SITE_OTHER): Payer: BC Managed Care – PPO | Admitting: Obstetrics and Gynecology

## 2017-03-24 VITALS — BP 122/74 | Wt 174.0 lb

## 2017-03-24 DIAGNOSIS — Z3A30 30 weeks gestation of pregnancy: Secondary | ICD-10-CM

## 2017-03-24 DIAGNOSIS — Z3401 Encounter for supervision of normal first pregnancy, first trimester: Secondary | ICD-10-CM

## 2017-03-24 MED ORDER — CYCLOBENZAPRINE HCL 10 MG PO TABS: 10.0000 mg | ORAL_TABLET | Freq: Three times a day (TID) | ORAL | 2 refills | Status: DC | PRN

## 2017-03-24 NOTE — Progress Notes (Signed)
Fell on Sunday, spoke to AMS yesterday/TDAP declined today/Blood tra

## 2017-04-07 ENCOUNTER — Ambulatory Visit (INDEPENDENT_AMBULATORY_CARE_PROVIDER_SITE_OTHER): Payer: BC Managed Care – PPO | Admitting: Obstetrics and Gynecology

## 2017-04-07 ENCOUNTER — Encounter: Payer: Self-pay | Admitting: Obstetrics and Gynecology

## 2017-04-07 VITALS — BP 118/74 | Wt 180.0 lb

## 2017-04-07 DIAGNOSIS — Z3A32 32 weeks gestation of pregnancy: Secondary | ICD-10-CM

## 2017-04-07 DIAGNOSIS — Z3490 Encounter for supervision of normal pregnancy, unspecified, unspecified trimester: Secondary | ICD-10-CM

## 2017-04-07 NOTE — Progress Notes (Signed)
No vb. No lof.  

## 2017-04-14 ENCOUNTER — Ambulatory Visit (INDEPENDENT_AMBULATORY_CARE_PROVIDER_SITE_OTHER): Payer: BC Managed Care – PPO | Admitting: Obstetrics and Gynecology

## 2017-04-14 VITALS — BP 116/74 | Temp 98.2°F | Wt 182.0 lb

## 2017-04-14 DIAGNOSIS — N3001 Acute cystitis with hematuria: Secondary | ICD-10-CM

## 2017-04-14 LAB — POCT URINALYSIS DIPSTICK
Bilirubin, UA: NEGATIVE
GLUCOSE UA: NEGATIVE
KETONES UA: NEGATIVE
Nitrite, UA: NEGATIVE
Spec Grav, UA: <=1.005 — AB (ref 1.010–1.025)
UROBILINOGEN UA: 0.2 U/dL
pH, UA: 6 (ref 5.0–8.0)

## 2017-04-14 MED ORDER — CEPHALEXIN 500 MG PO CAPS: 500.0000 mg | ORAL_CAPSULE | Freq: Two times a day (BID) | ORAL | 0 refills | Status: DC

## 2017-04-14 NOTE — Progress Notes (Signed)
uti symptoms. Lower back pain, dysuria, tender to urinate since friday

## 2017-04-14 NOTE — Progress Notes (Signed)
No chief complaint on file. UTI OB PT   HPI:      Ms. Kaitlin [name] is a 31 y.o. G1P0 who LMP was Patient's last menstrual period was 07/16/2016., presents today for UTI sx. She has noticed urinary frequency, urgency, urethral discomfort, and LBP for a couple days. She felt feverish yesterday and just felt bad all over. She has a hx of chronic UTIs vs IC. She had a UTI 3/18 and was treated with macrobid.  She is 33 weeks pregnancy. She denies any VB, LOF, contrxns. No vag sx.     Patient Active Problem List   Diagnosis Date Noted  . Interstitial cystitis 03/12/2017  . Encounter for supervision of low-risk pregnancy, antepartum 02/12/2017  . Allergic rhinitis 02/26/2015  . Anxiety disorder 02/26/2015  . Chronic UTI 02/26/2015  . Eczema 02/26/2015  . Intermittent asthma, uncomplicated 02/26/2015  . PCOS (polycystic ovarian syndrome) 02/26/2015  . Depression 02/18/2012    Family History  Problem Relation Age of Onset  . Multiple sclerosis Mother   . Breast cancer Maternal Grandmother   . Hypertension Maternal Grandmother   . Hypertension Maternal Grandfather   . Thyroid cancer Maternal Grandfather   . Lung cancer Paternal Grandmother   . Diabetes Mellitus II Paternal Grandfather   . Hypertension Paternal Grandfather     Social History   Social History  . Marital status: Single    Spouse name: N/A  . Number of children: N/A  . Years of education: N/A   Occupational History  . Not on file.   Social History Main Topics  . Smoking status: Former Games developer  . Smokeless tobacco: Never Used  . Alcohol use No  . Drug use: No  . Sexual activity: Yes   Other Topics Concern  . Not on file   Social History Narrative  . No narrative on file     Current Outpatient Prescriptions:  .  baclofen (LIORESAL) 20 MG tablet, , Disp: , Rfl:  .  cephALEXin (KEFLEX) 500 MG capsule, Take 1 capsule (500 mg total) by mouth 2 (two) times daily., Disp: 14 capsule, Rfl: 0 .   cetirizine (ZYRTEC) 10 MG tablet, Take by mouth., Disp: , Rfl:  .  Cholecalciferol (VITAMIN D3) 2000 units capsule, Take by mouth., Disp: , Rfl:  .  cyclobenzaprine (FLEXERIL) 10 MG tablet, Take 1 tablet (10 mg total) by mouth 3 (three) times daily as needed for muscle spasms., Disp: 30 tablet, Rfl: 2 .  fluticasone (FLONASE) 50 MCG/ACT nasal spray, , Disp: , Rfl:  .  nitrofurantoin (MACRODANTIN) 100 MG capsule, , Disp: , Rfl:  .  nitrofurantoin, macrocrystal-monohydrate, (MACROBID) 100 MG capsule, Take 1 capsule (100 mg total) by mouth 2 (two) times daily. (Patient not taking: Reported on 04/07/2017), Disp: 14 capsule, Rfl: 1 .  NONFORMULARY OR COMPOUNDED ITEM, [facility]: Diclofenac 3%, Baclofen 2%, Lidocaine 2%, apply 1-2 grams to affected area 3-4 times daily. (Patient not taking: Reported on 04/07/2017), Disp: 120 each, Rfl: 2 .  prenatal vitamin w/FE, FA (PRENATAL 1 + 1) 27-1 MG TABS tablet, Take 1 tablet by mouth daily at 12 noon., Disp: , Rfl:   Review of Systems  Constitutional: Negative for fever.  Gastrointestinal: Negative for blood in stool, constipation, diarrhea, nausea and vomiting.  Genitourinary: Positive for dysuria, frequency and urgency. Negative for dyspareunia, flank pain, hematuria, vaginal bleeding, vaginal discharge and vaginal pain.  Musculoskeletal: Positive for back pain.  Skin: Negative for rash.     OBJECTIVE:  Vitals:  BP 116/74   Temp 98.2 F (36.8 C)   Wt 182 lb (82.6 kg)   LMP 07/16/2016   Physical Exam  Constitutional: She is oriented to person, place, and time and well-developed, well-nourished, and in no distress. No distress.  Abdominal: There is CVA tenderness.  Neurological: She is alert and oriented to person, place, and time. Gait normal.  Vitals reviewed.   Results: Results for orders placed or performed in visit on 04/14/17  POCT Urinalysis Dipstick  Result Value Ref Range   Color, UA     Clarity, UA     Glucose, UA neg     Bilirubin, UA neg    Ketones, UA neg    Spec Grav, UA <=1.005 (A) 1.010 - 1.025   Blood, UA small    pH, UA 6.0 5.0 - 8.0   Protein, UA trace    Urobilinogen, UA 0.2 0.2 or 1.0 E.U./dL   Nitrite, UA neg    Leukocytes, UA Moderate (2+) (A) Negative      Assessment/Plan: Acute cystitis with hematuria - Rx keflex. Check C&S. F/u prn. - Plan: cephALEXin (KEFLEX) 500 MG capsule, Urine culture, POCT Urinalysis Dipstick   Meds ordered this encounter  Medications  . cephALEXin (KEFLEX) 500 MG capsule    Sig: Take 1 capsule (500 mg total) by mouth 2 (two) times daily.    Dispense:  14 capsule    Refill:  0      Return if symptoms worsen or fail to improve.  Brion Hedges B. Keniesha Adderly, PA-C 04/14/2017 4:02 PM

## 2017-04-14 NOTE — Progress Notes (Signed)
PROBLEM VISIT: UTI. Rx keflex. Check C&S. Has ROB next wk. No signs of contrxns/labor.

## 2017-04-16 LAB — URINE CULTURE

## 2017-04-19 ENCOUNTER — Inpatient Hospital Stay
Admission: EM | Admit: 2017-04-19 | Discharge: 2017-04-22 | DRG: 774 | Disposition: A | Discharge status: Home / Self Care | Payer: BC Managed Care – PPO | Attending: Obstetrics and Gynecology | Admitting: Obstetrics and Gynecology

## 2017-04-19 DIAGNOSIS — Z833 Family history of diabetes mellitus: Secondary | ICD-10-CM

## 2017-04-19 DIAGNOSIS — Z8249 Family history of ischemic heart disease and other diseases of the circulatory system: Secondary | ICD-10-CM

## 2017-04-19 DIAGNOSIS — Z91040 Latex allergy status: Secondary | ICD-10-CM

## 2017-04-19 DIAGNOSIS — O99824 Streptococcus B carrier state complicating childbirth: Secondary | ICD-10-CM | POA: Diagnosis present

## 2017-04-19 DIAGNOSIS — Z3A34 34 weeks gestation of pregnancy: Secondary | ICD-10-CM

## 2017-04-19 DIAGNOSIS — O42913 Preterm premature rupture of membranes, unspecified as to length of time between rupture and onset of labor, third trimester: Principal | ICD-10-CM | POA: Diagnosis present

## 2017-04-19 DIAGNOSIS — Z87891 Personal history of nicotine dependence: Secondary | ICD-10-CM

## 2017-04-20 DIAGNOSIS — Z87891 Personal history of nicotine dependence: Secondary | ICD-10-CM | POA: Diagnosis not present

## 2017-04-20 DIAGNOSIS — O99824 Streptococcus B carrier state complicating childbirth: Secondary | ICD-10-CM | POA: Diagnosis present

## 2017-04-20 DIAGNOSIS — O42913 Preterm premature rupture of membranes, unspecified as to length of time between rupture and onset of labor, third trimester: Secondary | ICD-10-CM | POA: Diagnosis not present

## 2017-04-20 DIAGNOSIS — Z91040 Latex allergy status: Secondary | ICD-10-CM | POA: Diagnosis not present

## 2017-04-20 DIAGNOSIS — Z8249 Family history of ischemic heart disease and other diseases of the circulatory system: Secondary | ICD-10-CM | POA: Diagnosis not present

## 2017-04-20 DIAGNOSIS — Z833 Family history of diabetes mellitus: Secondary | ICD-10-CM | POA: Diagnosis not present

## 2017-04-20 DIAGNOSIS — Z3A34 34 weeks gestation of pregnancy: Secondary | ICD-10-CM

## 2017-04-20 LAB — BASIC METABOLIC PANEL
Anion gap: 10 (ref 5–15)
BUN: 8 mg/dL (ref 6–20)
CHLORIDE: 107 mmol/L (ref 101–111)
CO2: 19 mmol/L — AB (ref 22–32)
CREATININE: 0.58 mg/dL (ref 0.44–1.00)
Calcium: 9.6 mg/dL (ref 8.9–10.3)
GFR calc Af Amer: >60 mL/min (ref >60)
GFR calc non Af Amer: >60 mL/min (ref >60)
Glucose, Bld: 95 mg/dL (ref 65–99)
Potassium: 3.6 mmol/L (ref 3.5–5.1)
SODIUM: 136 mmol/L (ref 135–145)

## 2017-04-20 LAB — CBC
HEMATOCRIT: 40.8 % (ref 35.0–47.0)
HEMOGLOBIN: 14 g/dL (ref 12.0–16.0)
MCH: 30.4 pg (ref 26.0–34.0)
MCHC: 34.3 g/dL (ref 32.0–36.0)
MCV: 88.7 fL (ref 80.0–100.0)
Platelets: 249 10*3/uL (ref 150–440)
RBC: 4.6 MIL/uL (ref 3.80–5.20)
RDW: 12.9 % (ref 11.5–14.5)
WBC: 15.1 10*3/uL — ABNORMAL HIGH (ref 3.6–11.0)

## 2017-04-20 LAB — TYPE AND SCREEN
ABO/RH(D): A POS
Antibody Screen: NEGATIVE

## 2017-04-20 LAB — LIPASE, BLOOD: Lipase: 21 U/L (ref 11–51)

## 2017-04-20 MED ORDER — DIBUCAINE 1 % RE OINT: 1.0000 "application " | TOPICAL_OINTMENT | RECTAL | Status: DC | PRN

## 2017-04-20 MED ORDER — LACTATED RINGERS IV SOLN: 500.0000 mL | INTRAVENOUS | Status: DC | PRN

## 2017-04-20 MED ORDER — LIDOCAINE HCL (PF) 1 % IJ SOLN
INTRAMUSCULAR | Status: AC
  Filled 2017-04-20: qty 30

## 2017-04-20 MED ORDER — SODIUM CHLORIDE 0.9 % IV SOLN
2.0000 g | Freq: Once | INTRAVENOUS | Status: AC
  Administered 2017-04-20: 2 g via INTRAVENOUS

## 2017-04-20 MED ORDER — OXYTOCIN 40 UNITS IN LACTATED RINGERS INFUSION - SIMPLE MED
INTRAVENOUS | Status: AC
  Filled 2017-04-20: qty 1000

## 2017-04-20 MED ORDER — OXYTOCIN BOLUS FROM INFUSION
500.0000 mL | Freq: Once | INTRAVENOUS | Status: AC
  Administered 2017-04-20: 500 mL via INTRAVENOUS

## 2017-04-20 MED ORDER — OXYTOCIN 10 UNIT/ML IJ SOLN
INTRAMUSCULAR | Status: AC
  Filled 2017-04-20: qty 2

## 2017-04-20 MED ORDER — CEPHALEXIN 500 MG PO CAPS
500.0000 mg | ORAL_CAPSULE | Freq: Two times a day (BID) | ORAL | Status: AC
  Administered 2017-04-20 – 2017-04-21 (×3): 500 mg via ORAL
  Filled 2017-04-20 (×4): qty 1

## 2017-04-20 MED ORDER — AMMONIA AROMATIC IN INHA
RESPIRATORY_TRACT | Status: AC
  Filled 2017-04-20: qty 10

## 2017-04-20 MED ORDER — ACETAMINOPHEN 325 MG PO TABS: 650.0000 mg | ORAL_TABLET | ORAL | Status: DC | PRN

## 2017-04-20 MED ORDER — LIDOCAINE HCL (PF) 1 % IJ SOLN: 30.0000 mL | INTRAMUSCULAR | Status: DC | PRN

## 2017-04-20 MED ORDER — ONDANSETRON HCL 4 MG/2ML IJ SOLN: 4.0000 mg | Freq: Four times a day (QID) | INTRAMUSCULAR | Status: DC | PRN

## 2017-04-20 MED ORDER — SOD CITRATE-CITRIC ACID 500-334 MG/5ML PO SOLN
30.0000 mL | ORAL | Status: DC | PRN
  Filled 2017-04-20: qty 30

## 2017-04-20 MED ORDER — SODIUM CHLORIDE 0.9 % IV SOLN
INTRAVENOUS | Status: AC
  Administered 2017-04-20: 2 g via INTRAVENOUS
  Filled 2017-04-20: qty 2000

## 2017-04-20 MED ORDER — DIPHENHYDRAMINE HCL 25 MG PO CAPS: 25.0000 mg | ORAL_CAPSULE | Freq: Four times a day (QID) | ORAL | Status: DC | PRN

## 2017-04-20 MED ORDER — BUTORPHANOL TARTRATE 1 MG/ML IJ SOLN: 1.0000 mg | INTRAMUSCULAR | Status: DC | PRN

## 2017-04-20 MED ORDER — PRENATAL MULTIVITAMIN CH
1.0000 | ORAL_TABLET | Freq: Every day | ORAL | Status: DC
  Administered 2017-04-20 – 2017-04-22 (×3): 1 via ORAL
  Filled 2017-04-20 (×4): qty 1

## 2017-04-20 MED ORDER — COCONUT OIL OIL
1.0000 "application " | TOPICAL_OIL | Status: DC | PRN
  Filled 2017-04-20: qty 120

## 2017-04-20 MED ORDER — OXYCODONE-ACETAMINOPHEN 5-325 MG PO TABS
1.0000 | ORAL_TABLET | ORAL | Status: DC | PRN
  Administered 2017-04-20 – 2017-04-21 (×5): 1 via ORAL
  Filled 2017-04-20 (×5): qty 1

## 2017-04-20 MED ORDER — SIMETHICONE 80 MG PO CHEW: 80.0000 mg | CHEWABLE_TABLET | ORAL | Status: DC | PRN

## 2017-04-20 MED ORDER — SODIUM CHLORIDE FLUSH 0.9 % IV SOLN
INTRAVENOUS | Status: AC
  Filled 2017-04-20: qty 10

## 2017-04-20 MED ORDER — BETAMETHASONE SOD PHOS & ACET 6 (3-3) MG/ML IJ SUSP: 12.0000 mg | Freq: Once | INTRAMUSCULAR | Status: DC

## 2017-04-20 MED ORDER — SENNOSIDES-DOCUSATE SODIUM 8.6-50 MG PO TABS
2.0000 | ORAL_TABLET | ORAL | Status: DC
  Administered 2017-04-21 – 2017-04-22 (×2): 2 via ORAL
  Filled 2017-04-20 (×2): qty 2

## 2017-04-20 MED ORDER — OXYTOCIN 40 UNITS IN LACTATED RINGERS INFUSION - SIMPLE MED: 2.5000 [IU]/h | INTRAVENOUS | Status: DC

## 2017-04-20 MED ORDER — TETANUS-DIPHTH-ACELL PERTUSSIS 5-2.5-18.5 LF-MCG/0.5 IM SUSP: 0.5000 mL | Freq: Once | INTRAMUSCULAR | Status: DC

## 2017-04-20 MED ORDER — BUTORPHANOL TARTRATE 2 MG/ML IJ SOLN
INTRAMUSCULAR | Status: AC
  Filled 2017-04-20: qty 2

## 2017-04-20 MED ORDER — LACTATED RINGERS IV SOLN: INTRAVENOUS | Status: DC

## 2017-04-20 MED ORDER — WITCH HAZEL-GLYCERIN EX PADS: 1.0000 "application " | MEDICATED_PAD | CUTANEOUS | Status: DC | PRN

## 2017-04-20 MED ORDER — MISOPROSTOL 200 MCG PO TABS
ORAL_TABLET | ORAL | Status: AC
  Filled 2017-04-20: qty 4

## 2017-04-20 MED ORDER — ONDANSETRON HCL 4 MG/2ML IJ SOLN: 4.0000 mg | INTRAMUSCULAR | Status: DC | PRN

## 2017-04-20 MED ORDER — IBUPROFEN 600 MG PO TABS
600.0000 mg | ORAL_TABLET | Freq: Four times a day (QID) | ORAL | Status: DC
  Administered 2017-04-20 – 2017-04-22 (×10): 600 mg via ORAL
  Filled 2017-04-20 (×10): qty 1

## 2017-04-20 MED ORDER — BENZOCAINE-MENTHOL 20-0.5 % EX AERO
1.0000 "application " | INHALATION_SPRAY | CUTANEOUS | Status: DC | PRN
  Administered 2017-04-20: 1 via TOPICAL
  Filled 2017-04-20: qty 56

## 2017-04-20 MED ORDER — ONDANSETRON HCL 4 MG PO TABS: 4.0000 mg | ORAL_TABLET | ORAL | Status: DC | PRN

## 2017-04-20 MED ORDER — OXYCODONE-ACETAMINOPHEN 5-325 MG PO TABS
ORAL_TABLET | ORAL | Status: AC
  Filled 2017-04-20: qty 2

## 2017-04-20 MED ORDER — OXYCODONE-ACETAMINOPHEN 5-325 MG PO TABS
2.0000 | ORAL_TABLET | ORAL | Status: DC | PRN
  Administered 2017-04-20 – 2017-04-22 (×4): 2 via ORAL
  Filled 2017-04-20 (×3): qty 2

## 2017-04-20 NOTE — Progress Notes (Signed)
Post Partum Day 0, s/p svd 9 hours Subjective: Doing well, no complaints.  Tolerating regular diet, pain with PO meds, voiding and ambulating without difficulty. Pt is in SCN at newborn bedside. She is adjusting to life with a 6 week preterm baby. [facility] has good support [facility]  No CP SOB F/C N/V or leg pain No HA, change of vision, RUQ/epigastric pain  Objective: BP 116/67 (BP Location: Right Arm)   Pulse 82   Temp 97.6 F (36.4 C) (Oral)   Resp 18   LMP 07/16/2016   SpO2 99%   Breastfeeding- planning   Physical Exam:  General: NAD CV: RRR Pulm: nl effort, CTABL Lochia: moderate Uterine Fundus: fundus firm and below umbilicus DVT Evaluation: no cords, ttp LEs    Recent Labs  04/20/17 0012  HGB 14.0  HCT 40.8  WBC 15.1*  PLT 249    Assessment/Plan: 30 y.o. G1P0100 postpartum day # 0  1. Continue routine postpartum care  2. A+, Rubella Immune, Varicella Immune  3. TDAP status: declined during prenatal care  4. Breast/Contraception: undecided  5. Disposition: discharge to home most likely PPD2   Kaitlin MallJane Yahira [name], CNM

## 2017-04-20 NOTE — Lactation Note (Signed)
This note was copied from a baby's chart. Lactation Consultation Note  Patient Name: Kaitlin Launa FlightStephanie Tinkham IONGE'XToday's Date: 04/20/2017 Reason for consult: Initial assessment;NICU baby   Maternal Data    Feeding    LATCH Score/Interventions                      Lactation Tools Discussed/Used Pump Review: Setup, frequency, and cleaning;Milk Storage Initiated by:: Leroy SeaJaniya Vennie Salsbury, MA, [name], CLC Date initiated:: 04/20/17   Consult Status Consult Status: Follow-up Mom has been instructed to pump every 2/3 hours for "[name]". Parents know how to set-up and clean pump pieces. Mom knows that flange size can changed based on nipple size and to notify staff if she needs a larger size. Parents don't have any questions at this time.   Burnadette PeterJaniya M Omari Mcmanaway 04/20/2017, 10:45 AM

## 2017-04-20 NOTE — Plan of Care (Signed)
Problem: Nutritional: Goal: Mothers verbalization of comfort with breastfeeding process will improve Outcome: Not Progressing [facility] Mom will [facility].  Problem: Role Relationship: Goal: Ability to demonstrate positive interaction with newborn will improve Outcome: Progressing Visited Infant is SCN

## 2017-04-20 NOTE — Discharge Summary (Signed)
Obstetric Discharge Summary Reason for Admission: preterm rupture of membranes, and preterm delivery Prenatal Procedures: none Intrapartum Procedures: spontaneous vaginal delivery Postpartum Procedures: none Complications-Operative and Postpartum: none Hemoglobin  Date Value Ref Range Status  04/21/2017 12.3 12.0 - 16.0 g/dL Final  16/10/960410/25/2017 54.014.0 g/dL Final   HCT  Date Value Ref Range Status  04/21/2017 36.3 35.0 - 47.0 % Final  10/01/2016 42 % Final   Hematocrit  Date Value Ref Range Status  03/10/2017 40.0 34.0 - 46.6 % Final    Physical Exam:  General: alert, cooperative and no distress Lochia: appropriate Uterine Fundus: firm Incision: n/a  DVT Evaluation: No evidence of DVT seen on physical exam. No cords or calf tenderness. No significant calf/ankle edema.  Discharge Diagnoses: Term Pregnancy-delivered  Discharge Information: Date: 04/22/2017 Activity: pelvic rest Diet: routine Allergies as of 04/22/2017      Reactions   Sulfa Antibiotics Shortness Of Breath   Eggs Or Egg-derived Products Hives   Latex Hives   Metronidazole Hives      Medication List    STOP taking these medications   baclofen 20 MG tablet Commonly known as:  LIORESAL   cephALEXin 500 MG capsule Commonly known as:  KEFLEX   cyclobenzaprine 10 MG tablet Commonly known as:  FLEXERIL   nitrofurantoin (macrocrystal-monohydrate) 100 MG capsule Commonly known as:  MACROBID   nitrofurantoin 100 MG capsule Commonly known as:  MACRODANTIN   NONFORMULARY OR COMPOUNDED ITEM     TAKE these medications   cetirizine 10 MG tablet Commonly known as:  ZYRTEC Take by mouth.   fluticasone 50 MCG/ACT nasal spray Commonly known as:  FLONASE   ibuprofen 600 MG tablet Commonly known as:  ADVIL,MOTRIN Take 1 tablet (600 mg total) by mouth every 6 (six) hours.   prenatal vitamin w/FE, FA 27-1 MG Tabs tablet Take 1 tablet by mouth daily at 12 noon.   Vitamin D3 2000 units capsule Take by  mouth.       Condition: stable Discharge to: home Follow-up Information    Kaitlin AustriaStaebler, Andreas, MD Follow up in 1 week(s).   Specialty:  Obstetrics and Gynecology Contact information: [address] [place] [id number] [phone]           Newborn Data: Live born female  Birth Weight:   APGAR: ,   NICU pending readiness for discharge  Kaitlin [name] 04/22/2017, 10:46 AM

## 2017-04-20 NOTE — H&P (Signed)
Obstetric H&P   Chief Complaint: Leaking fluid, contractions  Prenatal Care Provider: [facility]  History of Present Illness: 31 y.o. G1P0 [redacted]w[redacted]d by 05/30/2017, Date entered prior to episode creation presenting to L&D with LOF earlier this evening and contractions.  Had some loose stools earlier in the day.  Increasing abdominal pain and pressure .  Light spotting  A/Positive/-- (10/25 0000) / RI / Varicella Immune / XY on informaseq / HBsAg neg / RPR NR / 1-hr 117 /  GBS positive Review of Systems: 10 point review of systems negative unless otherwise noted in HPI  Past Medical History: History reviewed. No pertinent past medical history.  Past Surgical History: No past surgical history on file.  Family History: Family History  Problem Relation Age of Onset  . Multiple sclerosis Mother   . Breast cancer Maternal Grandmother   . Hypertension Maternal Grandmother   . Hypertension Maternal Grandfather   . Thyroid cancer Maternal Grandfather   . Lung cancer Paternal Grandmother   . Diabetes Mellitus II Paternal Grandfather   . Hypertension Paternal Grandfather     Social History: Social History   Social History  . Marital status: Single    Spouse name: N/A  . Number of children: N/A  . Years of education: N/A   Occupational History  . Not on file.   Social History Main Topics  . Smoking status: Former Games developer  . Smokeless tobacco: Never Used  . Alcohol use No  . Drug use: No  . Sexual activity: Yes   Other Topics Concern  . Not on file   Social History Narrative  . No narrative on file    Medications: Prior to Admission medications   Medication Sig Start Date End Date Taking? Authorizing Provider  prenatal vitamin w/FE, FA (PRENATAL 1 + 1) 27-1 MG TABS tablet Take 1 tablet by mouth daily at 12 noon.   Yes [provider]  baclofen (LIORESAL) 20 MG tablet  02/12/17   [provider]  cephALEXin (KEFLEX) 500 MG capsule Take 1 capsule (500 mg total) by  mouth 2 (two) times daily. 04/14/17 04/21/17  Copland, Ilona Sorrel, PA-C  cetirizine (ZYRTEC) 10 MG tablet Take by mouth. 02/14/11   [provider]  Cholecalciferol (VITAMIN D3) 2000 units capsule Take by mouth. 02/14/11   [provider]  cyclobenzaprine (FLEXERIL) 10 MG tablet Take 1 tablet (10 mg total) by mouth 3 (three) times daily as needed for muscle spasms. 03/24/17   [name], MD  fluticasone Aleda Grana) 50 MCG/ACT nasal spray  03/02/17   [provider]  nitrofurantoin (MACRODANTIN) 100 MG capsule  02/23/17   [provider]  nitrofurantoin, macrocrystal-monohydrate, (MACROBID) 100 MG capsule Take 1 capsule (100 mg total) by mouth 2 (two) times daily. Patient not taking: Reported on 04/07/2017 02/12/17   Nadara Mustard, MD  NONFORMULARY OR COMPOUNDED ITEM [facility]: Diclofenac 3%, Baclofen 2%, Lidocaine 2%, apply 1-2 grams to affected area 3-4 times daily. Patient not taking: Reported on 04/07/2017 02/11/17   Felecia Shelling, DPM    Allergies: Allergies  Allergen Reactions  . Sulfa Antibiotics Shortness Of Breath  . Eggs Or Egg-Derived Products Hives  . Latex Hives  . Metronidazole Hives    Physical Exam: Vitals: Last menstrual period 07/16/2016.  FHT: 120, moderate, +accels, no decels Toco: q72min  General: NAD HEENT: normocephalic, anicteric Pulmonary: No increased work of breathing Cardiovascular: RRR, distal pulses 2+ Abdomen: Gravid, non-tender Leopolds: vtx (ultrasound) Genitourinary: 6/C/0 Extremities: no edema, erythema,  or tenderness Neurologic: Grossly intact Psychiatric: mood appropriate, affect full  Labs: No results found for this or any previous visit (from the past 24 hour(s)).  Assessment: 31 y.o. G1P0 2825w2d by 05/30/2017,presenting with preterm rupture of membranes and premature labor  Plan: 1) Preterm labor  - ampicillin (GBS positive on urine culture) - BMZ  - >32 weeks magnesium sulfate not indicated  2)  Fetus - cat I tracing  3) PNL - A/Positive/-- (10/25 0000) / RI / Varicella Immune / XY on informaseq / HBsAg neg / RPR NR / 1-hr 117 /  GBS positive  4) Initial BP elevated but painfully contracting PIH labs sent  5) TDAP -  needs  6) Disposition - pending delivery

## 2017-04-21 ENCOUNTER — Encounter: Payer: BC Managed Care – PPO | Admitting: Obstetrics & Gynecology

## 2017-04-21 LAB — SURGICAL PATHOLOGY

## 2017-04-21 LAB — CBC
HCT: 36.3 % (ref 35.0–47.0)
HEMOGLOBIN: 12.3 g/dL (ref 12.0–16.0)
MCH: 30.2 pg (ref 26.0–34.0)
MCHC: 33.8 g/dL (ref 32.0–36.0)
MCV: 89.3 fL (ref 80.0–100.0)
PLATELETS: 235 10*3/uL (ref 150–440)
RBC: 4.06 MIL/uL (ref 3.80–5.20)
RDW: 13.6 % (ref 11.5–14.5)
WBC: 12.6 10*3/uL — AB (ref 3.6–11.0)

## 2017-04-21 LAB — RPR: RPR: NONREACTIVE

## 2017-04-21 NOTE — Progress Notes (Signed)
Post Partum Day 1; s/p premature labor and delivery at 34wk2d Subjective: no complaints, up ad lib, voiding and tolerating PO. Baby in NICU. Will be starting NG feedings. Patient is pumping. Has been on Keflex for GBS UTI-last dose today  Objective: Blood pressure 128/74, pulse 80, temperature 98.3 F (36.8 C), temperature source Axillary, resp. rate 20, last menstrual period 07/16/2016, SpO2 99 %, unknown if currently breastfeeding.  Physical Exam:  General: alert, cooperative and no distress Lochia: appropriate Uterine Fundus: firm  DVT Evaluation: No evidence of DVT seen on physical exam.   Recent Labs  04/20/17 0012 04/21/17 0505  HGB 14.0 12.3  HCT 40.8 36.3  WBC 15.1* 12.6*  PLT 249 235    Assessment/Plan: Plan for discharge tomorrow  Continue postpartum care A POS/ RI/ VI Breast   LOS: 1 day   Kaitlin [name] 04/21/2017, 6:11 PM

## 2017-04-21 NOTE — Lactation Note (Signed)
This note was copied from a baby's chart. Lactation Consultation Note  Patient Name: Kaitlin [name] ZOXWR'UToday's Date: 04/21/2017  During rounds this morning, I corrected/reviewed how to use the Symphony pump to induce lactation. I also instructed her and encouraged her to use "hands on breast" pumping as often as possible to maximize milk flow. I gave her 2 sets of 27 mm Flanges so she can have one pump at crib side and one to keep with her. Mom states she plans to get a Spectra pump to use at home. She plans to find out today when she can get her pump and let us know if she needs one from here until she gets her own. I encouraged a lot of skin to skin care when staff says it is OK for baby. I reviewed collection, storage and handling per BF booklet.    Maternal Data    Feeding Feeding Type: Formula Length of feed: 30 min  LATCH Score/Interventions                      Lactation Tools Discussed/Used     Consult Status      Sunday CornSandra Clark [name] 04/21/2017, 4:32 PM

## 2017-04-22 MED ORDER — IBUPROFEN 600 MG PO TABS: 600.0000 mg | ORAL_TABLET | Freq: Four times a day (QID) | ORAL | 0 refills | Status: DC

## 2017-04-22 NOTE — Progress Notes (Signed)
Discharge teaching completed prior to shift change. Patient visiting baby in SCN until 8PM then discharged home via wheelchair. Alert and oriented with NAD noted.

## 2017-04-22 NOTE — Progress Notes (Signed)
Discharge instructions, prescriptions and follow up appointment given to and reviewed with patient. Patient verbalized understanding. 

## 2017-04-27 ENCOUNTER — Ambulatory Visit (INDEPENDENT_AMBULATORY_CARE_PROVIDER_SITE_OTHER): Payer: BC Managed Care – PPO | Admitting: Obstetrics and Gynecology

## 2017-04-27 NOTE — Progress Notes (Signed)
Obstetrics & Gynecology Office Visit   Chief Complaint:  Chief Complaint  Patient presents with  . Postpartum Care    Vaginal drelivery 5/14    History of Present Illness: The patient is a 31 y.o. female presenting initial evaluation for symptoms of anxiety and depression.  The patient is currently taking not taking anything for the management of her symptoms.  She has had any recent situational stressors (delivery of 2334 weeker with NICU stay).  She reports symptoms of none (feels coping well), auditory hallucinations and visual hallucinations.  She denies anhedonia, day time somnolence, insomnia, risk taking behavior, irritability, increased appetite, decreased appetite, social anxiety, agorophobia, feelings of guilt and feelings of worthlessness. Symptoms have remained unchanged since last visit.     The patient does have a pre-existing history of depression and anxiety.  She  does not a prior history of suicide attempts.   Review of Systems:10 point review of systems  Past Medical History:  No past medical history on file.  Past Surgical History:  No past surgical history on file.  Gynecologic History: No LMP recorded.  Obstetric History: G1P0100  Family History:  Family History  Problem Relation Age of Onset  . Multiple sclerosis Mother   . Breast cancer Maternal Grandmother   . Hypertension Maternal Grandmother   . Hypertension Maternal Grandfather   . Thyroid cancer Maternal Grandfather   . Lung cancer Paternal Grandmother   . Diabetes Mellitus II Paternal Grandfather   . Hypertension Paternal Grandfather     Social History:  Social History   Social History  . Marital status: Single    Spouse name: N/A  . Number of children: N/A  . Years of education: N/A   Occupational History  . Not on file.   Social History Main Topics  . Smoking status: Former Games developermoker  . Smokeless tobacco: Never Used  . Alcohol use No  . Drug use: No  . Sexual activity: Yes    Other Topics Concern  . Not on file   Social History Narrative  . No narrative on file    Allergies:  Allergies  Allergen Reactions  . Sulfa Antibiotics Shortness Of Breath  . Eggs Or Egg-Derived Products Hives  . Latex Hives  . Metronidazole Hives    Medications: Prior to Admission medications   Medication Sig Start Date End Date Taking? Authorizing Provider  cetirizine (ZYRTEC) 10 MG tablet Take by mouth. 02/14/11  Yes [provider]  Cholecalciferol (VITAMIN D3) 2000 units capsule Take by mouth. 02/14/11  Yes [provider]  fluticasone Aleda Grana(FLONASE) 50 MCG/ACT nasal spray  03/02/17  Yes [provider]  ibuprofen (ADVIL,MOTRIN) 600 MG tablet Take 1 tablet (600 mg total) by mouth every 6 (six) hours. 04/22/17  Yes Conard NovakJackson, Stephen D, MD  prenatal vitamin w/FE, FA (PRENATAL 1 + 1) 27-1 MG TABS tablet Take 1 tablet by mouth daily at 12 noon.   Yes [provider]    Physical Exam Vitals:  Vitals:   04/27/17 0800  BP: 128/62  Pulse: 99   No LMP recorded.  General: NAD HEENT: normocephalic, anicteric Pulmonary: No increased work of breathing Extremities: no edema, erythema, or tenderness Neurologic: Grossly intact Psychiatric: mood appropriate, affect full  Female chaperone present for pelvic and breast  portions of the physical exam  Assessment: 31 y.o. G1P0100 follow up screening for postpartum depression  Plan: Problem List Items Addressed This Visit    None    Visit Diagnoses  Encounter for postpartum visit    -  Primary      - Doing well, discussed signs symptoms of postpartum depression, re-screen at 6 week postpartum visit - ParaGard IUD ordered - A total of 15 minutes were spent in face-to-face contact with the patient during this encounter with over half of that time devoted to counseling and coordination of care.

## 2017-04-28 ENCOUNTER — Ambulatory Visit: Payer: Self-pay

## 2017-04-28 ENCOUNTER — Telehealth: Payer: Self-pay

## 2017-04-28 NOTE — Telephone Encounter (Signed)
FMLA/DISABILITY form for [employer] filled out and given to [place] for processing.

## 2017-04-28 NOTE — Lactation Note (Signed)
This note was copied from a baby's chart. Lactation Consultation Note  Patient Name: Kaitlin Launa FlightStephanie Oberle GNFAO'ZToday's Date: 04/28/2017  [name] went to the breast today, I believe for the first time. By the time I was able to get to them, he had tried nursing for a few minutes and fell asleep. Mom states he was sucking only on her nipple. She does have large nipples. With him being LPT, I would try a nipple shield and maybe insert some of her milk inside it to get him interested. Right now, he is too sleepy to try anything else, so we are letting him enjoy skin to skin time. The 20mm can fit snugly, which [name] may prefer, but 24 felt better to Mom. She has both in pink case at cribside to use at future feeds. Mom also states that she has reason to believe she has Raynaud's, but it has not been diagnosed. Toes turn color when cold and are painful then. She c/o her nipples being sore when cold, but denies color changes. I encouraged her to share these S/S with her HCP. In the meantime, use warmth to nipples and avoid compression to nipple. I reviewed a lot of BF basics with her during this time re: position/latch; breast changes; milk supply, etc.      Maternal Data    Feeding Feeding Type: Breast Milk Length of feed: 30 min  LATCH Score/Interventions                      Lactation Tools Discussed/Used     Consult Status      Sunday CornSandra Clark Timmia Cogburn 04/28/2017, 12:56 PM

## 2017-04-29 ENCOUNTER — Ambulatory Visit: Payer: Self-pay

## 2017-04-29 NOTE — Lactation Note (Signed)
This note was copied from a baby's chart. Lactation Consultation Note  Patient Name: [name] ZOXWR'UToday's Date: 04/29/2017  I was able to give Mom 30 mm flanges for her breast pump today. She states they feel better on her right nipple but 27 is better on left. She also states nipples are not as sensitive today since using bigger flanges. During 2:15 feeding, he was rooting at first and Mom correctly applied NS (20mm) to right breast and had some drops of milk in there when she helped him onto nipple while he was in cross cradle hold. He mostly just held nipple in his mouth for about 6 minutes. A few times, he took a couple weak sucks. I heard one swallow (noisey room though). As he was resting and not sucking, he had a brady, which quickly corrected on its own. He was quite sleepy/flaccid almost, so we just let him rest while he was receiving his tube feeding.    Maternal Data    Feeding Feeding Type: Breast Milk Length of feed: 30 min  LATCH Score/Interventions                      Lactation Tools Discussed/Used     Consult Status      Sunday CornSandra Clark Taneya Conkel 04/29/2017, 5:50 PM

## 2017-04-29 NOTE — Lactation Note (Deleted)
This note was copied from a baby's chart. Lactation Consultation Note  Patient Name: Boy Launa FlightStephanie Upton YNWGN'FToday's Date: 04/29/2017  [facility] as well. Mom plans to pump and bottle feed until her milk supply increases, but still do skin to skin   Maternal Data    Feeding Feeding Type: Breast Milk Length of feed: 30 min  LATCH Score/Interventions                      Lactation Tools Discussed/Used     Consult Status      Sunday CornSandra Clark Hartwell Vandiver 04/29/2017, 5:48 PM

## 2017-05-01 ENCOUNTER — Ambulatory Visit: Payer: Self-pay

## 2017-05-01 NOTE — Lactation Note (Signed)
This note was copied from a baby's chart. Lactation Consultation Note  Patient Name: Boy Launa FlightStephanie Divis NWGNF'AToday's Date: 05/01/2017     Maternal Data    Feeding Feeding Type: Breast Milk Length of feed: 45 min  LATCH Score/Interventions                      Lactation Tools Discussed/Used Tools: Nipple Shields   Consult Status  Baby does great at the breast and was able to take in 36mL in 20mins for his 4th attempt at nursing.     Burnadette PeterJaniya M Lynett Brasil 05/01/2017, 12:38 PM

## 2017-05-02 ENCOUNTER — Ambulatory Visit: Payer: Self-pay

## 2017-05-02 NOTE — Lactation Note (Signed)
This note was copied from a baby's chart. Lactation Consultation Note  Patient Name: [name] RUEAV'WToday's Date: 05/02/2017 Reason for consult: Follow-up assessment;NICU baby Latched with nipple shield, sl sleepy, breast fed on both breasts, noted milk in nipple shield chamber, 12 cc intake per pre/post wt performed by Augusto GambleJerri RN, mom pumps approx. 3 oz every 3 hrs.  Maternal Data Does the patient have breastfeeding experience prior to this delivery?: No  Feeding Feeding Type: Breast Fed Length of feed: 25 min  LATCH Score/Interventions                      Lactation Tools Discussed/Used Tools: Nipple [facility]   Consult Status      Dyann KiefMarsha D Diego Delancey 05/02/2017, 5:17 PM

## 2017-05-05 ENCOUNTER — Emergency Department
Admission: EM | Admit: 2017-05-05 | Discharge: 2017-05-05 | Disposition: A | Discharge status: Home / Self Care | Payer: BC Managed Care – PPO | Attending: Emergency Medicine | Admitting: Emergency Medicine

## 2017-05-05 ENCOUNTER — Emergency Department: Payer: BC Managed Care – PPO

## 2017-05-05 ENCOUNTER — Encounter: Payer: Self-pay | Admitting: Emergency Medicine

## 2017-05-05 ENCOUNTER — Ambulatory Visit: Payer: Self-pay

## 2017-05-05 ENCOUNTER — Encounter: Payer: BC Managed Care – PPO | Admitting: Advanced Practice Midwife

## 2017-05-05 DIAGNOSIS — Z87891 Personal history of nicotine dependence: Secondary | ICD-10-CM | POA: Insufficient documentation

## 2017-05-05 DIAGNOSIS — Z79899 Other long term (current) drug therapy: Secondary | ICD-10-CM | POA: Diagnosis not present

## 2017-05-05 DIAGNOSIS — Z91040 Latex allergy status: Secondary | ICD-10-CM | POA: Insufficient documentation

## 2017-05-05 DIAGNOSIS — N938 Other specified abnormal uterine and vaginal bleeding: Secondary | ICD-10-CM | POA: Diagnosis not present

## 2017-05-05 LAB — COMPREHENSIVE METABOLIC PANEL WITH GFR
ALT: 41 U/L (ref 14–54)
AST: 27 U/L (ref 15–41)
Albumin: 4.2 g/dL (ref 3.5–5.0)
Alkaline Phosphatase: 81 U/L (ref 38–126)
Anion gap: 9 (ref 5–15)
BUN: 22 mg/dL — ABNORMAL HIGH (ref 6–20)
CO2: 26 mmol/L (ref 22–32)
Calcium: 9.5 mg/dL (ref 8.9–10.3)
Chloride: 104 mmol/L (ref 101–111)
Creatinine, Ser: 0.66 mg/dL (ref 0.44–1.00)
GFR calc Af Amer: >60 mL/min (ref >60)
GFR calc non Af Amer: >60 mL/min (ref >60)
Glucose, Bld: 94 mg/dL (ref 65–99)
Potassium: 3.4 mmol/L — ABNORMAL LOW (ref 3.5–5.1)
Sodium: 139 mmol/L (ref 135–145)
Total Bilirubin: 0.6 mg/dL (ref 0.3–1.2)
Total Protein: 7.8 g/dL (ref 6.5–8.1)

## 2017-05-05 LAB — CBC WITH DIFFERENTIAL/PLATELET
BASOS ABS: 0.1 10*3/uL (ref 0–0.1)
BASOS PCT: 1 %
Eosinophils Absolute: 0.3 10*3/uL (ref 0–0.7)
Eosinophils Relative: 3 %
HEMATOCRIT: 43 % (ref 35.0–47.0)
HEMOGLOBIN: 14.6 g/dL (ref 12.0–16.0)
LYMPHS PCT: 36 %
Lymphs Abs: 3.3 10*3/uL (ref 1.0–3.6)
MCH: 30.3 pg (ref 26.0–34.0)
MCHC: 33.9 g/dL (ref 32.0–36.0)
MCV: 89.5 fL (ref 80.0–100.0)
MONO ABS: 0.6 10*3/uL (ref 0.2–0.9)
Monocytes Relative: 7 %
NEUTROS ABS: 4.9 10*3/uL (ref 1.4–6.5)
NEUTROS PCT: 53 %
Platelets: 303 10*3/uL (ref 150–440)
RBC: 4.81 MIL/uL (ref 3.80–5.20)
RDW: 12.9 % (ref 11.5–14.5)
WBC: 9.2 10*3/uL (ref 3.6–11.0)

## 2017-05-05 LAB — HCG, QUANTITATIVE, PREGNANCY: HCG, BETA CHAIN, QUANT, S: 2 m[IU]/mL (ref <5)

## 2017-05-05 MED ORDER — METHYLERGONOVINE MALEATE 0.2 MG PO TABS: 0.2000 mg | ORAL_TABLET | Freq: Four times a day (QID) | ORAL | 0 refills | Status: AC

## 2017-05-05 MED ORDER — METHYLERGONOVINE MALEATE 0.2 MG PO TABS
0.2000 mg | ORAL_TABLET | ORAL | Status: AC
  Administered 2017-05-05: 0.2 mg via ORAL
  Filled 2017-05-05: qty 1

## 2017-05-05 NOTE — Discharge Instructions (Signed)
As we discussed, your exam was reassuring today.  Dr. Tiburcio PeaHarris feels he may be experiencing what may be relatively normal postpartum vaginal bleeding.  We gave you one dose of medicine in the emergency department and a prescription for some additional doses to take tomorrow after the pharmacy opens and you can get the prescription filled.  However we recommend that you call the clinic as soon as they open in the morning to schedule a follow-up appointment with Dr. Tiburcio PeaHarris or Dr. Chauncey CruelStabler at the next available opportunity tomorrow.  Return to the emergency department if you develop new or worsening symptoms that concern you.

## 2017-05-05 NOTE — ED Triage Notes (Addendum)
Pt 15 days post partum, reports passing large clots today. Was seen at [facility] last week to be checked for post partum depression but did not have a vaginal exam.

## 2017-05-05 NOTE — ED Triage Notes (Signed)
States passing large blood clots since 1600.  Patient is 15 days post partum and vaginal bleeding had been minimal for about 3 days.  Initially having some lower abdominal cramping, but none currently.

## 2017-05-05 NOTE — ED Provider Notes (Signed)
[facility] Emergency Department Provider Note  ____________________________________________   First MD Initiated Contact with Patient 05/05/17 2211     (approximate)  I have reviewed the triage vital signs and the nursing notes.   HISTORY  Chief Complaint Vaginal Bleeding    HPI Kaitlin [name] is a 31 y.o. female who had a spontaneous vaginal delivery 15 days ago of her first pregnancy at approximately [redacted] weeks gestation by Dr. Chauncey Cruel with [facility].  She presents today with acute onset heavy vaginal bleeding with clots.  She has had minimal lower abdominal cramping, mostly is concerned about the heavy bleeding.  She has had some minimal discharge and bleeding since the delivery but there was an acute onset today of "gushing blood" and clots the size of her fist.  She does not feel lightheaded nor dizzy and denies fever/chills, chest pain, shortness of breath, nausea, vomiting.  The intensity of the bleeding was severe but the abdominal cramping is only mild.  She has been in the NICU extensively with her son and has been breast-feeding but they suggested she come to the emergency department for evaluation.  Nothing in particular makes the symptoms better nor worse.   History reviewed. No pertinent past medical history.  Patient Active Problem List   Diagnosis Date Noted  . Preterm delivery, delivered 04/22/2017  . Labor and delivery indication for care or intervention 04/20/2017  . Interstitial cystitis 03/12/2017  . Encounter for supervision of low-risk pregnancy, antepartum 02/12/2017  . Allergic rhinitis 02/26/2015  . Anxiety disorder 02/26/2015  . Chronic UTI 02/26/2015  . Eczema 02/26/2015  . Intermittent asthma, uncomplicated 02/26/2015  . PCOS (polycystic ovarian syndrome) 02/26/2015  . Depression 02/18/2012    History reviewed. No pertinent surgical history.  Prior to Admission medications   Medication Sig Start Date End Date Taking?  Authorizing Provider  cetirizine (ZYRTEC) 10 MG tablet Take by mouth. 02/14/11   [provider]  Cholecalciferol (VITAMIN D3) 2000 units capsule Take by mouth. 02/14/11   [provider]  fluticasone Aleda Grana) 50 MCG/ACT nasal spray  03/02/17   [provider]  ibuprofen (ADVIL,MOTRIN) 600 MG tablet Take 1 tablet (600 mg total) by mouth every 6 (six) hours. 04/22/17   Conard Novak, MD  methylergonovine (METHERGINE) 0.2 MG tablet Take 1 tablet (0.2 mg total) by mouth every 6 (six) hours. 05/05/17 05/06/17  Loleta Rose, MD  prenatal vitamin w/FE, FA (PRENATAL 1 + 1) 27-1 MG TABS tablet Take 1 tablet by mouth daily at 12 noon.    [provider]    Allergies Sulfa antibiotics; Eggs or egg-derived products; Latex; and Metronidazole  Family History  Problem Relation Age of Onset  . Multiple sclerosis Mother   . Breast cancer Maternal Grandmother   . Hypertension Maternal Grandmother   . Hypertension Maternal Grandfather   . Thyroid cancer Maternal Grandfather   . Lung cancer Paternal Grandmother   . Diabetes Mellitus II Paternal Grandfather   . Hypertension Paternal Grandfather     Social History Social History  Substance Use Topics  . Smoking status: Former Games developer  . Smokeless tobacco: Never Used  . Alcohol use No    Review of Systems Constitutional: No fever/chills Eyes: No visual changes. ENT: No sore throat. Cardiovascular: Denies chest pain. Respiratory: Denies shortness of breath. Gastrointestinal: No abdominal pain.  No nausea, no vomiting.  No diarrhea.  No constipation. Genitourinary: acute onset heavy vaginal bleeding with clots, 15 days post-partum.  Negative for dysuria.  Musculoskeletal: Negative for neck pain.  Negative for back pain. Integumentary: Negative for rash. Neurological: Negative for headaches, focal weakness or numbness.   ____________________________________________   PHYSICAL EXAM:  VITAL SIGNS: ED Triage  Vitals  Enc Vitals Group     BP 05/05/17 1857 123/87     Pulse Rate 05/05/17 1857 (!) 105     Resp 05/05/17 1857 16     Temp 05/05/17 1857 98.6 F (37 C)     Temp Source 05/05/17 1857 Oral     SpO2 05/05/17 1857 96 %     Weight 05/05/17 1858 73.5 kg (162 lb)     Height 05/05/17 1858 1.6 m (5\' 3" )     Head Circumference --      Peak Flow --      Pain Score 05/05/17 2156 1     Pain Loc --      Pain Edu? --      Excl. in GC? --     Constitutional: Alert and oriented. Well appearing and in no acute distress. Eyes: Conjunctivae are normal.  Head: Atraumatic. Nose: No congestion/rhinnorhea. Mouth/Throat: Mucous membranes are moist. Neck: No stridor.  No meningeal signs.   Cardiovascular: Normal rate, regular rhythm. Good peripheral circulation. Grossly normal heart sounds. Respiratory: Normal respiratory effort.  No retractions. Lungs CTAB. Gastrointestinal: Soft and nontender. No distention.  Genitourinary: Dark blood is evident externally.  There is a mild amount of dark blood in the vaginal vault which I suctioned by Yankauer.  Cervical os is closed with no clots in the office and only a small amount of blood coming from the os with fundal pressure.   ED chaperone present throughout exam.   Musculoskeletal: No lower extremity tenderness nor edema. No gross deformities of extremities. Neurologic:  Normal speech and language. No gross focal neurologic deficits are appreciated.  Skin:  Skin is warm, dry and intact. No rash noted. Psychiatric: Mood and affect are normal. Speech and behavior are normal.  ____________________________________________   LABS (all labs ordered are listed, but only abnormal results are displayed)  Labs Reviewed  COMPREHENSIVE METABOLIC PANEL - Abnormal; Notable for the following:       Result Value   Potassium 3.4 (*)    BUN 22 (*)    All other components within normal limits  CBC WITH DIFFERENTIAL/PLATELET  HCG, QUANTITATIVE, PREGNANCY    ____________________________________________  EKG  None - EKG not ordered by ED physician ____________________________________________  RADIOLOGY   US Pelvis Complete  Result Date: 05/05/2017 CLINICAL DATA:  31 year old female, 15 date postpartum presenting with vaginal bleeding and passing large blood clots. EXAM: TRANSABDOMINAL ULTRASOUND OF PELVIS TECHNIQUE: Transabdominal ultrasound examination of the pelvis was performed including evaluation of the uterus, ovaries, adnexal regions, and pelvic cul-de-sac. COMPARISON:  None. FINDINGS: Uterus Measurements: 10.5 x 5.7 x 7.4 cm. There is a 1.3 x 1.0 x 1.2 cm exophytic lesion arising from the uterine fundus most likely a partially exophytic fibroid. Endometrium Thickness: The endometrium is thickened, heterogeneous and echogenic. Color images demonstrate flow to the periphery of the echogenic endometrial content. Although this may represent blood products, findings concerning for retained products of conception. Correlation with clinical exam and HCG levels and obstetrical consult is advised. Right ovary Measurements: 3.2 x 2.2 x 2.0 cm. Normal appearance/no adnexal mass. Left ovary Measurements: 2.9 x 1.6 x 2.2 cm. Normal appearance/no adnexal mass. Other findings:  No abnormal free fluid. IMPRESSION: 1. Thickened and heterogeneous endometrium with peripheral flow concerning for retained products of  conception. Correlation with clinical exam and HCG levels and obstetrical consult is advised. 2. Unremarkable ovaries. Electronically Signed   By: Elgie CollardArash  Radparvar M.D.   On: 05/05/2017 20:38    ____________________________________________   PROCEDURES  Critical Care performed: No   Procedure(s) performed:   Procedures   ____________________________________________   INITIAL IMPRESSION / ASSESSMENT AND PLAN / ED COURSE  Pertinent labs & imaging results that were available during my care of the patient were reviewed by me and  considered in my medical decision making (see chart for details).  Reassuring vital signs and lab work although I would not expect the hemoglobin did drop significantly with the acute onset of the bleeding.  The patient would prefer not to have a pelvic exam by me if she is going to need one by the OB/GYN.  I will call on call OB/GYN for [facility] and discuss the ultrasound findings of questionable retained products and her current clinical presentation and asked for additional management recommendations..   Clinical Course as of May 06 2319  Tue May 05, 2017  2250 I spoke with phone with Dr. Tiburcio PeaHarris [facility]  He recommended that I do the pelvic exam and if the os is closed and if there is not any evidence that acute intervention is required, he recommended Methergine 0.2 mg by mouth now and a prescription for 4 more doses starting first thing in the morning as soon as the pharmacy opens.  He also stated that he would see the patient in the clinic tomorrow.As documented in the exam above, the exam was reassuring with no clots evident in the os and the os essentially closed with just a small amount of blood leaking from it when I apply fundal pressure.  The patient is hemodynamically stable and currently has a stable hematocrit even though the bleeding is acute and I would not expect it to drop yet.  Her hCG is only 2.  I believe she is stable for close outpatient follow-up.  The first dose of methargen has been ordered and I did advise her that it may lead to decreased lactation but that seems to be more common with longer term administration.  She agrees with the plan and will follow up first thing in the morning.  [CF]    Clinical Course User Index [CF] Loleta RoseForbach, Nance Mccombs, MD    ____________________________________________  FINAL CLINICAL IMPRESSION(S) / ED DIAGNOSES  Final diagnoses:  Other specified abnormal uterine and vaginal bleeding     MEDICATIONS GIVEN DURING THIS  VISIT:  Medications  methylergonovine (METHERGINE) tablet 0.2 mg (0.2 mg Oral Given 05/05/17 2302)     NEW OUTPATIENT MEDICATIONS STARTED DURING THIS VISIT:  Discharge Medication List as of 05/05/2017 11:05 PM    START taking these medications   Details  methylergonovine (METHERGINE) 0.2 MG tablet Take 1 tablet (0.2 mg total) by mouth every 6 (six) hours., Starting Tue 05/05/2017, Until Wed 05/06/2017, Print        Discharge Medication List as of 05/05/2017 11:05 PM      Discharge Medication List as of 05/05/2017 11:05 PM       Note:  This document was prepared using Dragon voice recognition software and may include unintentional dictation errors.    Loleta RoseForbach, Kelise Kuch, MD 05/05/17 2320

## 2017-05-05 NOTE — ED Notes (Signed)
Spoke with Dr Darnelle CatalanMalinda regarding pt's presenting cc; order obtained for u/s for further evaluation of bleeding

## 2017-05-05 NOTE — ED Notes (Signed)

## 2017-05-05 NOTE — Lactation Note (Signed)
This note was copied from a baby's chart. Lactation Consultation Note  Patient Name: Boy Launa FlightStephanie Zehner ZOXWR'UToday's Date: 05/05/2017 Reason for consult: Follow-up assessment [name] is much more alert this week for me than last week. 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 The tip is pulled back when he stretches his tongue, which does not get much elevation.  OT Everlene FarrierSusan Woffard and I spoke with Mom about tongue and lip ties (Lip tie is not too bad as lips can flange) we both believe it would be advisable for him to be assessed by specialist after results from lab come back and he is stable enough for a TT consult (especially since it can effect milk transfer and reflux, etc)  Meanwhile, he latches well enough to 20 mm nipple shield so as to transfer some milk and not cause any trauma to mom's nipple. He started out with vigorous suck swallow pattern, but after 10 minutes, Mom states he "choked" (I had stepped away for a couple minutes) so she took him off to pat his back and then put him back on breast when he rooted for it. He as then less vigorous. When it looked like he was falling asleep, he had brady of 71. He was removed to recover, which he quickly did. His RN Maralyn SagoSarah reports that he has done many back to back feedings by bottle or breast. Perhaps he is telling us he needs more rest in between?    Maternal Data    Feeding Feeding Type: Breast Fed Length of feed: 40 min  LATCH Score/Interventions Latch:  (with 20 mm NS)  Audible Swallowing: Spontaneous and intermittent (choked on milk when he got sleepy; brady x 2; stopped feed)  Type of Nipple: Everted at rest and after stimulation  Comfort (Breast/Nipple): Soft / non-tender     Hold (Positioning): Assistance needed to correctly position infant at breast and maintain latch. Intervention(s): Support Pillows;Position options     Lactation Tools Discussed/Used Tools: Nipple Reznor Ferrando;8F feeding tube / Syringe   Consult Status Consult  Status: PRN    Sunday CornSandra Clark Channin Agustin 05/05/2017, 12:28 PM

## 2017-05-06 ENCOUNTER — Ambulatory Visit (INDEPENDENT_AMBULATORY_CARE_PROVIDER_SITE_OTHER): Payer: BC Managed Care – PPO | Admitting: Obstetrics & Gynecology

## 2017-05-06 ENCOUNTER — Encounter: Payer: Self-pay | Admitting: Obstetrics & Gynecology

## 2017-05-06 NOTE — Progress Notes (Signed)
   Subjective:     Kaitlin [name] is a 31 y.o. 381P0100 female who presents for a bleeding episode in the postpartum. She is 2 weeks postpartum following a Preterm pregnancy <37 weeks and delivery by Vaginal, no problems at delivery.  I have fully reviewed the prenatal and intrapartum course.  Placenta to pathology, normal.  Yesterday had heavy bleeding and clots episode al;l of a sudden.  No dizziness.  Went to ER, normal blood counts and exam; US done and not conclusive as lining heterogeneous.  Treated w Methergine one oral dose and sent home.  Min bleeding since yesterday.  The following portions of the patient's history were reviewed and updated as appropriate: allergies, current medications, past family history, past medical history, past social history, past surgical history and problem list.  Review of Systems Pertinent items noted in HPI and remainder of comprehensive ROS otherwise negative.  Objective:    BP 100/60   Pulse 98   Ht 5\' 3"  (1.6 m)   Wt 165 lb (74.8 kg)   BMI 29.23 kg/m   General:  alert and no distress  Lungs: clear to auscultation bilaterally  Heart:  regular rate and rhythm, S1, S2 normal, no murmur, click, rub or gallop  Abdomen: soft, non-tender; bowel sounds normal; no masses,  no organomegaly.            Assessment:     PP BLEEDING, Delayed Stable  Plan:  Plan to watch for now D&C vs Methergine full 24 hour course as options   Annamarie MajorPaul Isao Seltzer, MD, Merlinda FrederickFACOG [facility], [facility] 05/06/2017  1:58 PM

## 2017-05-08 ENCOUNTER — Ambulatory Visit: Payer: Self-pay

## 2017-05-08 NOTE — Lactation Note (Signed)
This note was copied from a baby's chart. Assisted mom with breast feeding using #24 nipple shield per mom's request and tolerated larger size well.  Kaitlin [name] was not as alert or vigorously sucking at the breast as the last time I assisted.  Audible swallows when he was rhythmically sucking and mom could feel strong tugs.  One brady noted, but only went down to 90's and SATs down to 83. As soon as he was taken off the breast he recovered quickly back to 100% SATs and normal heart rate.  He rooted back for the breast and latched without difficulty.

## 2017-05-12 ENCOUNTER — Ambulatory Visit: Payer: Self-pay

## 2017-05-12 NOTE — Lactation Note (Signed)
This note was copied from a baby's chart. Lactation Consultation Note  Patient Name: Kaitlin Launa FlightStephanie [name] [id number]'IToday's Date: 05/12/2017  [name] called me to crib side to answer a list of questions she had. I addressed all her and Dad's breastfeeding questions; recommended she keep the same feeding plan (BF twice a day and keep pumping)  at home that has been working well for them for at least a few more days.  I suggest they have LC consult 2-4 days after DC to review and adjust feeding plan as needed while tracking weights.  She has been feeding [name] with a nipple shield, which says says has been working well. He may need it until close to his "due date". [name] would be helpful in helping her decide when to increase BFs; decrease pumpings; and wean from shield, etc.  Maternal Data    Feeding Feeding Type: Breast Fed Length of feed: 22 min  LATCH Score/Interventions                      Lactation Tools Discussed/Used     Consult Status      [name] 05/12/2017, 5:18 PM

## 2017-05-20 ENCOUNTER — Ambulatory Visit: Payer: Self-pay

## 2017-05-20 NOTE — Lactation Note (Signed)
This note was copied from a baby's chart. Lactation Consultation Note  Patient Name: Kaitlin [name] [id number]'WToday's Date: 05/20/2017     Maternal Data  Mom says that she wasn't able to breastfeed baby yesterday and is concerned about her milk supply.   Feeding  Baby was able to latch onto mom for 25 mins w/o clothing on with the assistance from a nipple shield. Baby took in 50mL and nipple shield was full of colostrum. Baby switched over to other breast and took 28mL for a total of 78mL for the entire feeding.  LATCH Score/Interventions   Baby scored a 10                   Lactation Tools Discussed/Used  Nipple shield and DEBP.   Consult Status  Mom and Dad have been instructed to do TT reversal exercises 5-6 times a day for the next 2-3weeks. Parents are supposed to work on breastfeedings as opposed to pumping. Mom will f/u with LC at next week's ME meeting.     Burnadette PeterJaniya M Ellenor Wisniewski 05/20/2017, 3:35 PM

## 2017-06-01 ENCOUNTER — Encounter: Payer: Self-pay | Admitting: Obstetrics and Gynecology

## 2017-06-01 ENCOUNTER — Ambulatory Visit (INDEPENDENT_AMBULATORY_CARE_PROVIDER_SITE_OTHER): Payer: BC Managed Care – PPO | Admitting: Obstetrics and Gynecology

## 2017-06-01 DIAGNOSIS — Z30430 Encounter for insertion of intrauterine contraceptive device: Secondary | ICD-10-CM

## 2017-06-01 HISTORY — PX: INTRAUTERINE DEVICE INSERTION: SHX323

## 2017-06-01 NOTE — Progress Notes (Signed)
Postpartum Visit  Chief Complaint:  Chief Complaint  Patient presents with  . Contraception    Paragard insertion  . Postpartum Care    History of Present Illness: Patient is a 31 y.o. G1P0100 presents for postpartum visit.  Date of delivery: 04/14/16 Type of delivery: Vaginal delivery - Vacuum or forceps assisted  no Episiotomy No.  Laceration: no  Pregnancy or labor problems:  Preterm labor 34 weeks Any problems since the delivery:  no  Newborn Details:  SINGLETON :  1. Birth weight: 5lbs 8oz Maternal Details:  Breast Feeding:  yes Post partum depression/anxiety noted:  no Edinburgh Post-Partum Depression Score:  5  Date of last PAP: 10/01/2016 normal   Review of Systems: Review of Systems  Constitutional: Negative for chills, fever and malaise/fatigue.  Gastrointestinal: Negative for abdominal pain.  Psychiatric/Behavioral: Negative for depression and suicidal ideas. The patient is not nervous/anxious.     Past Medical History:  No past medical history on file.  Past Surgical History:  No past surgical history on file.  Family History:  Family History  Problem Relation Age of Onset  . Multiple sclerosis Mother   . Breast cancer Maternal Grandmother   . Hypertension Maternal Grandmother   . Hypertension Maternal Grandfather   . Thyroid cancer Maternal Grandfather   . Lung cancer Paternal Grandmother   . Diabetes Mellitus II Paternal Grandfather   . Hypertension Paternal Grandfather     Social History:  Social History   Social History  . Marital status: Single    Spouse name: N/A  . Number of children: N/A  . Years of education: N/A   Occupational History  . Not on file.   Social History Main Topics  . Smoking status: Former Games developer  . Smokeless tobacco: Never Used  . Alcohol use No  . Drug use: No  . Sexual activity: Yes   Other Topics Concern  . Not on file   Social History Narrative  . No narrative on file    Allergies:    Allergies  Allergen Reactions  . Sulfa Antibiotics Shortness Of Breath  . Eggs Or Egg-Derived Products Hives  . Latex Hives  . Metronidazole Hives    Medications: Prior to Admission medications   Medication Sig Start Date End Date Taking? Authorizing Provider  cetirizine (ZYRTEC) 10 MG tablet Take by mouth. 02/14/11  Yes [provider]  Cholecalciferol (VITAMIN D3) 2000 units capsule Take by mouth. 02/14/11  Yes [provider]  fluticasone Aleda Grana) 50 MCG/ACT nasal spray  03/02/17  Yes [provider]  prenatal vitamin w/FE, FA (PRENATAL 1 + 1) 27-1 MG TABS tablet Take 1 tablet by mouth daily at 12 noon.   Yes [provider]    Physical Exam Vitals:  Vitals:   06/01/17 0859  BP: 130/68  Pulse: 88    General: NAD HEENT: normocephalic, anicteric Pulmonary: No increased work of breathing Abdomen: NABS, soft, non-tender, non-distended.  Umbilicus without lesions.  No hepatomegaly, splenomegaly or masses palpable. No evidence of hernia.  Genitourinary:  External: Normal external female genitalia.  Normal urethral meatus, normal Bartholin's and Skene's glands.    Vagina: Normal vaginal mucosa, no evidence of prolapse.    Cervix: Grossly normal in appearance, no bleeding  Uterus: Non-enlarged, mobile, normal contour.  No CMT  Adnexa: ovaries non-enlarged, no adnexal masses  Rectal: deferred Extremities: no edema, erythema, or tenderness Neurologic: Grossly intact Psychiatric: mood appropriate, affect full   GYNECOLOGY OFFICE PROCEDURE NOTE  Judeth Cornfield  R Bernie CoveyCumbie is a 31 y.o. G1P0100 here for Paraguard IUD insertion. No GYN concerns.  The patient is currently using nothing for contraception and her LMP is Patient's last menstrual period was 05/30/2017 (exact date)..  The indication for her IUD is contraception/cycle control.  Negative predictive value of 99%-100% - < OR = 7 days from the onset of a normal menstrual cycle - has not had sexual  intercourse since the start of the last menstrual cycle - has correctly and consistently used a reliable form of contraception - < or = 7 days from an induced abortion - is within 4 weeks postpartum - is exclusively breast feeding (>85% of feeds), amenorrheic, and <6 months postpartum   "[place] Selected Practice Recommendations for Contraceptive Use", July 06, 2015  IUD Insertion Procedure Note Patient identified, informed consent performed, consent signed.   Discussed risks of irregular bleeding, cramping, infection, malpositioning, expulsion or uterine perforation of the IUD (1:1000 placements)  which may require further procedure such as laparoscopy.  IUD while effective at preventing pregnancy do not prevent transmission of sexually transmitted diseases and use of barrier methods for this purpose was discussed. Time out was performed.  Urine pregnancy test negative.  Speculum placed in the vagina.  Cervix visualized.  Cleaned with Betadine x 2.  Grasped anteriorly with a single tooth tenaculum.  Uterus sounded to 7.5 cm. IUD placed per manufacturer's recommendations.  Strings trimmed to 3 cm. Tenaculum was removed, good hemostasis noted.  Patient tolerated procedure well.   Patient was given post-procedure instructions.  She was advised to have backup contraception for one week.  Patient was also asked to check IUD strings periodically and follow up in 6 weeks for IUD check.  Assessment: 31 y.o. G1P0100 presenting for 6 week postpartum visit and paraguard insertion  Plan: Problem List Items Addressed This Visit    None    Visit Diagnoses    Routine postpartum follow-up    -  Primary   Encounter for IUD insertion           1) Contraception Education given regarding options for contraception, including IUD placement.  2)  Pap - ASCCP guidelines and rational discussed.  Patient opts for q3yearscreening interval  3) Patient underwent screening for postpartum depression with no concerns  noted.  4) Follow up 1 year for routine annual exam. 6 weeks string check

## 2017-06-11 ENCOUNTER — Telehealth: Payer: Self-pay

## 2017-06-11 NOTE — Telephone Encounter (Signed)
Additional FMLA/DISABILITY form for [employer] filled out and given to [place] for processing.

## 2017-07-07 ENCOUNTER — Ambulatory Visit (INDEPENDENT_AMBULATORY_CARE_PROVIDER_SITE_OTHER): Payer: BC Managed Care – PPO | Admitting: Obstetrics and Gynecology

## 2017-07-07 ENCOUNTER — Encounter: Payer: Self-pay | Admitting: Obstetrics and Gynecology

## 2017-07-07 VITALS — BP 112/72 | HR 84 | Wt 160.0 lb

## 2017-07-07 DIAGNOSIS — Z30431 Encounter for routine checking of intrauterine contraceptive device: Secondary | ICD-10-CM | POA: Diagnosis not present

## 2017-07-07 NOTE — Progress Notes (Signed)
Obstetrics & Gynecology Office Visit   Chief Complaint:  Chief Complaint  Patient presents with  . string check    IUD placed 6/25    History of Present Illness: 31 y.o. patient presenting for follow up of Paragard IUD placement 6 week ago.  She denies any complications since her IUD placement.  Still having some occasional spotting.  is able to feel strings.    Review of Systems: Review of Systems  Constitutional: Negative for chills, fever and malaise/fatigue.  Gastrointestinal: Negative for abdominal pain.    Past Medical History:  History reviewed. No pertinent past medical history.  Past Surgical History:  Past Surgical History:  Procedure Laterality Date  . INTRAUTERINE DEVICE INSERTION  06/01/2017   [facility]    Gynecologic History: No LMP recorded (exact date).  Obstetric History: G1P0100  Family History:  Family History  Problem Relation Age of Onset  . Multiple sclerosis Mother   . Breast cancer Maternal Grandmother   . Hypertension Maternal Grandmother   . Hypertension Maternal Grandfather   . Thyroid cancer Maternal Grandfather   . Lung cancer Paternal Grandmother   . Diabetes Mellitus II Paternal Grandfather   . Hypertension Paternal Grandfather     Social History:  Social History   Social History  . Marital status: Single    Spouse name: N/A  . Number of children: N/A  . Years of education: N/A   Occupational History  . Not on file.   Social History Main Topics  . Smoking status: Former Games developermoker  . Smokeless tobacco: Never Used  . Alcohol use No  . Drug use: No  . Sexual activity: Yes   Other Topics Concern  . Not on file   Social History Narrative  . No narrative on file    Allergies:  Allergies  Allergen Reactions  . Sulfa Antibiotics Shortness Of Breath  . Eggs Or Egg-Derived Products Hives  . Latex Hives  . Metronidazole Hives    Medications: Prior to Admission medications   Medication Sig Start Date End Date  Taking? Authorizing Provider  cetirizine (ZYRTEC) 10 MG tablet Take by mouth. 02/14/11  Yes [provider]  Cholecalciferol (VITAMIN D3) 2000 units capsule Take by mouth. 02/14/11  Yes [provider]  fluticasone Aleda Grana(FLONASE) 50 MCG/ACT nasal spray  03/02/17  Yes [provider]  prenatal vitamin w/FE, FA (PRENATAL 1 + 1) 27-1 MG TABS tablet Take 1 tablet by mouth daily at 12 noon.   Yes [provider]    Physical Exam Vitals:  Vitals:   07/07/17 1402  BP: 112/72  Pulse: 84   No LMP recorded (exact date).  General: NAD HEENT: normocephalic, anicteric Pulmonary: No increased work of breathing Cardiovascular: RRR, distal pulses 2+ Genitourinary:  External: Normal external female genitalia.  Normal urethral meatus, normal  Bartholin's and Skene's glands.    Vagina: Normal vaginal mucosa, no evidence of prolapse.    Cervix: Grossly normal in appearance, no bleeding, IUD strings visualized 2cm  Uterus: Non-enlarged, mobile, normal contour.  No CMT  Adnexa: ovaries non-enlarged, no adnexal masses  Rectal: deferred  Lymphatic: no evidence of inguinal lymphadenopathy Extremities: no edema, erythema, or tenderness Neurologic: Grossly intact Psychiatric: mood appropriate, affect full  Female chaperone present for pelvic and breast  portions of the physical exam  Assessment: 31 y.o. G1P0100 IUD string check Plan: Problem List Items Addressed This Visit    None    Visit Diagnoses    IUD check up    -  Primary      - Strings visualized in appropriate location and length - No issues or complications - A total of 15 minutes were spent in face-to-face contact with the patient during this encounter with over half of that time devoted to counseling and coordination of care.

## 2017-07-13 ENCOUNTER — Ambulatory Visit: Payer: BC Managed Care – PPO | Admitting: Obstetrics and Gynecology

## 2017-07-29 ENCOUNTER — Ambulatory Visit (INDEPENDENT_AMBULATORY_CARE_PROVIDER_SITE_OTHER): Payer: BC Managed Care – PPO | Admitting: Certified Nurse Midwife

## 2017-07-29 ENCOUNTER — Encounter: Payer: Self-pay | Admitting: Certified Nurse Midwife

## 2017-07-29 VITALS — BP 100/60 | HR 100 | Temp 98.4°F | Ht 63.0 in | Wt 159.0 lb

## 2017-07-29 DIAGNOSIS — N61 Mastitis without abscess: Secondary | ICD-10-CM | POA: Diagnosis not present

## 2017-07-29 MED ORDER — DICLOXACILLIN SODIUM 500 MG PO CAPS: 500.0000 mg | ORAL_CAPSULE | Freq: Four times a day (QID) | ORAL | 0 refills | Status: AC

## 2017-07-29 NOTE — Progress Notes (Signed)
Obstetrics & Gynecology Office Visit   Chief Complaint:  Chief Complaint  Patient presents with  . Breast Problem    mastitis    History of Present Illness: 31 year old G1 P0101 WF, s/p SVD 04/20/2017, presents with a 2 day history of body aches, flu-like symptoms, and a tender left breast. Feels hot and cold. Taking Tylenol for pain and fever. Last dose 0530 this AM. Went back to work 2 weeks ago and began pumping during the day, nursing at noon and then nursing all night. Had some nipple trauma to the left nipple when starting to pump which has now healed. She also reports having Raynaulds of the nipple.   Review of Systems:  Review of Systems  Constitutional: Positive for chills and fever.  Gastrointestinal: Negative for abdominal pain.  Skin:       Redness on left breast   Breast:   Past Medical History:  Past Medical History:  Diagnosis Date  . Anxiety and depression   . Asthma   . Eczema   . Interstitial cystitis   . Premature labor before 37 weeks of gestation with delivery, delivered    PPROM at 34 weeks    Past Surgical History:  Past Surgical History:  Procedure Laterality Date  . Excision of lymph node  02/24/2013   sublingual lymph node: Dr Gertie Baron  . INTRAUTERINE DEVICE INSERTION  06/01/2017   [facility]  . WISDOM TOOTH EXTRACTION      Gynecologic History: Patient's last menstrual period was 07/17/2017 (exact date).  Obstetric History: G1P0101  Family History:  Family History  Problem Relation Age of Onset  . Multiple sclerosis Mother   . Breast cancer Maternal Grandmother   . Hypertension Maternal Grandfather   . Thyroid cancer Maternal Grandfather   . Lung cancer Paternal Grandmother   . Hypertension Paternal Grandmother   . Diabetes Mellitus II Paternal Grandfather   . Hypertension Paternal Grandfather     Social History:  Social History   Social History  . Marital status: Single    Spouse name: N/A  . Number of children: 1  .  Years of education: N/A   Occupational History  . Not on file.   Social History Main Topics  . Smoking status: Former Games developer  . Smokeless tobacco: Never Used  . Alcohol use No  . Drug use: No  . Sexual activity: Yes    Birth control/ protection: IUD   Other Topics Concern  . Not on file   Social History Narrative  . No narrative on file    Allergies:  Allergies  Allergen Reactions  . Sulfa Antibiotics Shortness Of Breath  . Eggs Or Egg-Derived Products Hives  . Latex Hives  . Metronidazole Hives    Medications: Prior to Admission medications   Medication Sig Start Date End Date Taking? Authorizing Provider  cetirizine (ZYRTEC) 10 MG tablet Take by mouth. 02/14/11  Yes [provider]  Cholecalciferol (VITAMIN D3) 2000 units capsule Take by mouth. 02/14/11  Yes [provider]  fluticasone Aleda Grana) 50 MCG/ACT nasal spray  03/02/17  Yes [provider]  prenatal vitamin w/FE, FA (PRENATAL 1 + 1) 27-1 MG TABS tablet Take 1 tablet by mouth daily at 12 noon.   Yes [provider]           Physical Exam Vitals: BP 100/60   Pulse 100   Temp 98.4 F (36.9 C)   Ht 5\' 3"  (1.6 m)   Wt 159 lb (  72.1 kg)   LMP 07/17/2017 (Exact Date)   Breastfeeding? Yes   BMI 28.17 kg/m     Physical Exam  Constitutional: She is oriented to person, place, and time. She appears well-developed and well-nourished. No distress.  HENT:  Head: Normocephalic.  Respiratory:    Left breast: Inflammation of left breast from 11-3 o'clock just outside of the areola, no masses, nipple intact, no LAN in axilla Right breast: no masses, no inflammation, NT, nipple intact.  Neurological: She is alert and oriented to person, place, and time.  Psychiatric: She has a normal mood and affect. Her behavior is normal. Thought content normal.     Assessment: 31 y.o. G1P0101 with left mastitis  Plan: Continue to breast feed Begin antibiotics: dicloxacillin 500 mgm QID x  10 days Discussed comfort measures like warm soaks prior to pumping. Can continue Tylenol Follow up in 1 week or sooner if no improvement in sx after 48 hours on antibiotics  Farrel Conners, CNM  Farrel Conners, [place]

## 2017-08-05 ENCOUNTER — Ambulatory Visit (INDEPENDENT_AMBULATORY_CARE_PROVIDER_SITE_OTHER): Payer: BC Managed Care – PPO | Admitting: Maternal Newborn

## 2017-08-05 ENCOUNTER — Encounter: Payer: Self-pay | Admitting: Maternal Newborn

## 2017-08-05 VITALS — BP 102/62 | HR 70 | Ht 63.0 in | Wt 162.0 lb

## 2017-08-05 DIAGNOSIS — N649 Disorder of breast, unspecified: Secondary | ICD-10-CM | POA: Diagnosis not present

## 2017-08-05 DIAGNOSIS — O9279 Other disorders of lactation: Secondary | ICD-10-CM | POA: Diagnosis not present

## 2017-08-05 DIAGNOSIS — N921 Excessive and frequent menstruation with irregular cycle: Secondary | ICD-10-CM

## 2017-08-05 DIAGNOSIS — Z975 Presence of (intrauterine) contraceptive device: Secondary | ICD-10-CM

## 2017-08-05 DIAGNOSIS — N6459 Other signs and symptoms in breast: Secondary | ICD-10-CM

## 2017-08-05 LAB — POCT URINE PREGNANCY: Preg Test, Ur: NEGATIVE

## 2017-08-05 NOTE — Progress Notes (Signed)
Obstetrics & Gynecology Office Visit   Chief Complaint:  Chief Complaint  Patient presents with  . mastitis    follow up, left breast    History of Present Illness: 31 year old Kaitlin [name] presents for a follow up visit for left breast mastitis and lactation difficulties. She continues on antibiotics prescribed on 07/29/17. Her left breast symptoms have resolved. She continues to experience Raynaulds with vasospasm in both nipples. She also reports bilateral nipple pain, worsening when she pumps. Also complains of irregular cycles postpartum.  Review of Systems:  Constitutional: Negative for fever and chills Gastrointestinal: Negative for abdominal pain and cramping Skin: Negative for erythema, blisters Breast: Negative for breast lumps, breast pain. Positive for nipple pain.   Past Medical History:  Past Medical History:  Diagnosis Date  . Anxiety and depression   . Asthma   . Eczema   . Interstitial cystitis   . Premature labor before 37 weeks of gestation with delivery, delivered    PPROM at 34 weeks    Past Surgical History:  Past Surgical History:  Procedure Laterality Date  . Excision of lymph node  02/24/2013   sublingual lymph node: Dr Gertie Baron  . INTRAUTERINE DEVICE INSERTION  06/01/2017   [facility]  . WISDOM TOOTH EXTRACTION      Gynecologic History: Patient's last menstrual period was 07/17/2017 (exact date).  Obstetric History: Kaitlin [name]  Family History:  Family History  Problem Relation Age of Onset  . Multiple sclerosis Mother   . Breast cancer Maternal Grandmother   . Hypertension Maternal Grandfather   . Thyroid cancer Maternal Grandfather   . Lung cancer Paternal Grandmother   . Hypertension Paternal Grandmother   . Diabetes Mellitus II Paternal Grandfather   . Hypertension Paternal Grandfather     Social History:  Social History   Social History  . Marital status: Single    Spouse name: N/A  . Number of children: 1  . Years of  education: N/A   Occupational History  . Not on file.   Social History Main Topics  . Smoking status: Former Games developer  . Smokeless tobacco: Never Used  . Alcohol use No  . Drug use: No  . Sexual activity: Yes    Birth control/ protection: IUD   Other Topics Concern  . Not on file   Social History Narrative  . No narrative on file    Allergies:  Allergies  Allergen Reactions  . Sulfa Antibiotics Shortness Of Breath  . Eggs Or Egg-Derived Products Hives  . Latex Hives  . Metronidazole Hives    Medications: Prior to Admission medications   Medication Sig Start Date End Date Taking? Authorizing Provider  cetirizine (ZYRTEC) 10 MG tablet Take by mouth. 02/14/11  Yes [provider]  Cholecalciferol (VITAMIN D3) 2000 units capsule Take by mouth. 02/14/11  Yes [provider]  cyclobenzaprine (FLEXERIL) 10 MG tablet TAKE 1 TABLET (10 MG TOTAL) BY MOUTH 3 (THREE) TIMES DAILY AS NEEDED FOR MUSCLE SPASMS. 07/31/17  Yes [provider]  dicloxacillin (DYNAPEN) 500 MG capsule Take 1 capsule (500 mg total) by mouth 4 (four) times daily. 07/29/17 08/08/17 Yes Farrel Conners, CNM  fluticasone Aleda Grana) 50 MCG/ACT nasal spray  03/02/17  Yes [provider]  prenatal vitamin w/FE, FA (PRENATAL 1 + 1) 27-1 MG TABS tablet Take 1 tablet by mouth daily at 12 noon.   Yes [provider]    Physical Exam Vitals:  Vitals:   08/05/17 1531  BP: 102/Kaitlin  Pulse: 70   Patient's last menstrual period was 07/17/2017 (exact date).  General: NAD HEENT: normocephalic, anicteric Breast: No lumps, breast tissue non-tender, nipples show areas where abrasions have healed Neurologic: Grossly intact Psychiatric: mood appropriate, affect full   Assessment: 31 y.o. Kaitlin [name] with resolved mastitis, continuing symptoms of Reynauld's and nipple soreness.   Plan: Problem List Items Addressed This Visit    None    Visit Diagnoses    Painful lactation    -  Primary     Breakthrough bleeding with IUD       Relevant Orders   POCT urine pregnancy (Completed)   Breast complaint         1) Patient was concerned with irregular bleeding in post-partum and following Paragard IUD and was worried that she may have become pregnant before IUD placement. Urine pregnancy test negative.  2) Continue with breastfeeding. Patient will complete course of antibiotics. Encouraged use of nipple cream such as coconut oil or Lasinoh to relieve nipple pain and help healing abrasions, especially when pumping. Advised her to consider changing pump flanges as ill-fitting flanges may worsen pain and symptoms. Encouraged wool/warming breast pads.  Follow up as needed for returning sx of infection.   A total of 15 minutes were spent in face-to-face contact with the patient during this encounter with over half of that time devoted to counseling and coordination of care.  Marcelyn BruinsJacelyn Jacy Howat, CNM 08/05/2017  4:20 PM

## 2017-09-28 ENCOUNTER — Encounter: Payer: Self-pay | Admitting: Obstetrics and Gynecology

## 2017-10-28 ENCOUNTER — Encounter: Payer: Self-pay | Admitting: Obstetrics and Gynecology

## 2017-10-28 ENCOUNTER — Other Ambulatory Visit (INDEPENDENT_AMBULATORY_CARE_PROVIDER_SITE_OTHER): Payer: BC Managed Care – PPO

## 2017-10-28 ENCOUNTER — Ambulatory Visit (INDEPENDENT_AMBULATORY_CARE_PROVIDER_SITE_OTHER): Payer: BC Managed Care – PPO | Admitting: Obstetrics and Gynecology

## 2017-10-28 VITALS — BP 120/70 | HR 85 | Ht 63.0 in | Wt 161.0 lb

## 2017-10-28 DIAGNOSIS — T8332XA Displacement of intrauterine contraceptive device, initial encounter: Secondary | ICD-10-CM | POA: Diagnosis not present

## 2017-10-28 DIAGNOSIS — Z975 Presence of (intrauterine) contraceptive device: Secondary | ICD-10-CM | POA: Diagnosis not present

## 2017-10-28 DIAGNOSIS — J029 Acute pharyngitis, unspecified: Secondary | ICD-10-CM

## 2017-10-28 DIAGNOSIS — J4520 Mild intermittent asthma, uncomplicated: Secondary | ICD-10-CM

## 2017-10-28 DIAGNOSIS — N921 Excessive and frequent menstruation with irregular cycle: Secondary | ICD-10-CM | POA: Diagnosis not present

## 2017-10-28 DIAGNOSIS — Z30433 Encounter for removal and reinsertion of intrauterine contraceptive device: Secondary | ICD-10-CM

## 2017-10-28 MED ORDER — LEVONORGESTREL 20 MCG/24HR IU IUD: 1.0000 | INTRAUTERINE_SYSTEM | Freq: Once | INTRAUTERINE | 0 refills | Status: DC

## 2017-10-28 MED ORDER — ALBUTEROL SULFATE HFA 108 (90 BASE) MCG/ACT IN AERS: 2.0000 | INHALATION_SPRAY | RESPIRATORY_TRACT | 3 refills | Status: DC | PRN

## 2017-10-28 MED ORDER — FLUTICASONE PROPIONATE 50 MCG/ACT NA SUSP: 2.0000 | Freq: Every day | NASAL | 1 refills | Status: DC

## 2017-10-28 NOTE — Progress Notes (Signed)
Chief Complaint  Patient presents with  . Menstrual Problem    feels like she has a period every 2 weeks, also feels like shes getting a cold,wants refills on inhalers    HPI:      Ms. Kaitlin [name] is a 31 y.o. G1P0101 who LMP was Patient's last menstrual period was 10/07/2017., presents today for DUB sx with IUD for several months. Pt had paragard placed 6/18 post partum. She is currently breastfeeding 6 times daily. She notes bleeding Q2-3 wks, lasting 5-12 days. The 12 day cycles are spotting only for about 7 days. Pt has mild dysmen, no clots. Hx of PCOS prior to pregnancy with infrequent menses. No vaginal dryness, postcoital bleeding, unusual dyspareunia, pelvic pain.   She also complains of sore throat for the past 2 days, feels like swallowing shards of glass. No cough, face or teeth pain, no fevers. Husband recently had strep. Pt needs RF on albuterol and flonase. She has mild asthma but sx exacerbated with URIs.  She would also like her IUD strings trimmed since they are very long and uncomfortable for husband.    Past Medical History:  Diagnosis Date  . Anxiety and depression   . Asthma   . Eczema   . Interstitial cystitis   . PCOS (polycystic ovarian syndrome)   . Premature labor before 37 weeks of gestation with delivery, delivered    PPROM at 34 weeks    Past Surgical History:  Procedure Laterality Date  . Excision of lymph node  02/24/2013   sublingual lymph node: Dr Gertie Baron  . INTRAUTERINE DEVICE INSERTION  06/01/2017   [facility]  . WISDOM TOOTH EXTRACTION      Family History  Problem Relation Age of Onset  . Multiple sclerosis Mother   . Breast cancer Maternal Grandmother   . Hypertension Maternal Grandfather   . Thyroid cancer Maternal Grandfather   . Lung cancer Paternal Grandmother   . Hypertension Paternal Grandmother   . Diabetes Mellitus II Paternal Grandfather   . Hypertension Paternal Grandfather     Social History    Socioeconomic History  . Marital status: Single    Spouse name: Not on file  . Number of children: 1  . Years of education: Not on file  . Highest education level: Not on file  Social Needs  . Financial resource strain: Not on file  . Food insecurity - worry: Not on file  . Food insecurity - inability: Not on file  . Transportation needs - medical: Not on file  . Transportation needs - non-medical: Not on file  Occupational History  . Not on file  Tobacco Use  . Smoking status: Former Games developer  . Smokeless tobacco: Never Used  Substance and Sexual Activity  . Alcohol use: No  . Drug use: No  . Sexual activity: Yes    Birth control/protection: IUD  Other Topics Concern  . Not on file  Social History Narrative  . Not on file     Current Outpatient Medications:  .  albuterol (VENTOLIN HFA) 108 (90 Base) MCG/ACT inhaler, Inhale 2 puffs into the lungs every 4 (four) hours as needed for wheezing or shortness of breath., Disp: 1 Inhaler, Rfl: 3 .  cetirizine (ZYRTEC) 10 MG tablet, Take by mouth., Disp: , Rfl:  .  Cholecalciferol (VITAMIN D3) 2000 units capsule, Take by mouth., Disp: , Rfl:  .  cyclobenzaprine (FLEXERIL) 10 MG tablet, TAKE 1 TABLET (10 MG TOTAL) BY MOUTH 3 (THREE) TIMES  DAILY AS NEEDED FOR MUSCLE SPASMS., Disp: , Rfl: 2 .  fluticasone (FLONASE) 50 MCG/ACT nasal spray, Place 2 sprays into both nostrils daily., Disp: 16 g, Rfl: 1 .  levonorgestrel (MIRENA, 52 MG,) 20 MCG/24HR IUD, 1 Intra Uterine Device (1 each total) by Intrauterine route once for 1 dose., Disp: 1 Intra Uterine Device, Rfl: 0 .  prenatal vitamin w/FE, FA (PRENATAL 1 + 1) 27-1 MG TABS tablet, Take 1 tablet by mouth daily at 12 noon., Disp: , Rfl:    ROS:  Review of Systems  Constitutional: Negative for chills, fatigue and fever.  HENT: Positive for postnasal drip, rhinorrhea, sneezing and sore throat. Negative for congestion, ear pain, sinus pressure and sinus pain.   Respiratory: Negative for  cough, chest tightness, shortness of breath and wheezing.   Gastrointestinal: Negative for blood in stool, constipation, diarrhea, nausea and vomiting.  Genitourinary: Positive for menstrual problem. Negative for dyspareunia, dysuria, flank pain, frequency, hematuria, urgency, vaginal bleeding, vaginal discharge and vaginal pain.  Musculoskeletal: Negative for back pain.  Skin: Negative for rash.  Neurological: Negative for dizziness, light-headedness and headaches.     OBJECTIVE:   Vitals:  BP 120/70   Pulse 85   Ht 5\' 3"  (1.6 m)   Wt 161 lb (73 kg)   LMP 10/07/2017   BMI 28.52 kg/m   Physical Exam  Constitutional: She is oriented to person, place, and time and well-developed, well-nourished, and in no distress. Vital signs are normal.  HENT:  Mouth/Throat: Mucous membranes are normal. Posterior oropharyngeal erythema present. No oropharyngeal exudate, posterior oropharyngeal edema or tonsillar abscesses.  Neck: Normal range of motion. No thyromegaly present.  Cardiovascular: Normal rate and regular rhythm.  Pulmonary/Chest: Effort normal and breath sounds normal. No respiratory distress. She has no wheezes. She has no rales.  Genitourinary: Vagina normal, uterus normal, cervix normal, right adnexa normal, left adnexa normal and vulva normal. Uterus is not enlarged. Cervix exhibits no motion tenderness and no tenderness. Right adnexum displays no mass and no tenderness. Left adnexum displays no mass and no tenderness. Vulva exhibits no erythema, no exudate, no lesion, no rash and no tenderness. Vagina exhibits no lesion.  Genitourinary Comments: IUD STRINGS IN CX OS  Lymphadenopathy:       Head (right side): No submandibular, no tonsillar, no preauricular and no posterior auricular adenopathy present.       Head (left side): No submandibular, no tonsillar, no preauricular and no posterior auricular adenopathy present.    She has cervical adenopathy.  Neurological: She is alert and  oriented to person, place, and time.  Psychiatric: Affect and judgment normal.  Vitals reviewed.   RESULTS:  GYN U/S-->    Assessment/Plan: Breakthrough bleeding with IUD - Check labs and u/s. IUD not in correct position. Paragard removed. Pt would like another mirena. Mirena inserted today. RTO in 4 wks for check. - Plan: TSH + free T4, US PELVIS TRANSVANGINAL NON-OB (TV ONLY)  Displacement of intrauterine contraceptive device, initial encounter  Encounter for removal and reinsertion of intrauterine contraceptive device (IUD) - Paragard removed, Mirena inserted. - Plan: levonorgestrel (MIRENA, 52 MG,) 20 MCG/24HR IUD  Sore throat - Most likely viral on exam. No rapid strep tests available. NSAIDs/warm salt water gargles. F/u prn.   Mild intermittent asthma without complication - Rx RF albuterol and flonase (sx worse with URI). - Plan: albuterol (VENTOLIN HFA) 108 (90 Base) MCG/ACT inhaler, fluticasone (FLONASE) 50 MCG/ACT nasal spray   Meds ordered this encounter  Medications  .  albuterol (VENTOLIN HFA) 108 (90 Base) MCG/ACT inhaler    Sig: Inhale 2 puffs into the lungs every 4 (four) hours as needed for wheezing or shortness of breath.    Dispense:  1 Inhaler    Refill:  3  . fluticasone (FLONASE) 50 MCG/ACT nasal spray    Sig: Place 2 sprays into both nostrils daily.    Dispense:  16 g    Refill:  1  . levonorgestrel (MIRENA, 52 MG,) 20 MCG/24HR IUD    Sig: 1 Intra Uterine Device (1 each total) by Intrauterine route once for 1 dose.    Dispense:  1 Intra Uterine Device    Refill:  0      Return in about 4 weeks (around 11/25/2017) for IUD check.    IUD Removal Strings of IUD identified and grasped.  IUD removed without problem with ring forceps.  Pt tolerated this well.  IUD noted to be intact.  IUD Insertion Procedure Note Patient identified, informed consent performed, consent signed.   Discussed risks of irregular bleeding, cramping, infection, malpositioning or  misplacement of the IUD outside the uterus which may require further procedure such as laparoscopy, risk of failure <1%. Time out was performed.    Speculum placed in the vagina.  Cervix visualized.  Cleaned with Betadine x 2.  Grasped anteriorly with a single tooth tenaculum.  Uterus sounded to 6.0cm.   IUD placed per manufacturer's recommendations.  Strings trimmed to 3 cm. Tenaculum was removed, good hemostasis noted.  Patient tolerated procedure well.    Alicia B. Copland, PA-C 10/28/2017 5:03 PM

## 2017-10-28 NOTE — Patient Instructions (Addendum)
I value your feedback and entrusting us with your care. If you get a [facility] patient survey, I would appreciate you taking the time to let us know about your experience today. Thank you!  [facility] [phone]  Instructions after IUD insertion  Most women experience no significant problems after insertion of an IUD, however minor cramping and spotting for a few days is common. Cramps may be treated with ibuprofen 800mg every 8 hours or Tylenol 650 mg every 4 hours. Contact [facility] immediately if you experience any of the following symptoms during the next week: temperature >99.6 degrees, worsening pelvic pain, abdominal pain, fainting, unusually heavy vaginal bleeding, foul vaginal discharge, or if you think you have expelled the IUD.  Nothing inserted in the vagina for 48 hours. You will be scheduled for a follow up visit in approximately four weeks.  You should check monthly to be sure you can feel the IUD strings in the upper vagina. If you are having a monthly period, try to check after each period. If you cannot feel the IUD strings,  contact [facility] immediately so we can do an exam to determine if the IUD has been expelled.   Please use backup protection until we can confirm the IUD is in place.  Call [facility] if you are exposed to or diagnosed with a sexually transmitted infection, as we will need to discuss whether it is safe for you to continue using an IUD.   

## 2017-10-29 LAB — TSH+FREE T4
Free T4: 1.29 ng/dL (ref 0.82–1.77)
TSH: 0.803 u[IU]/mL (ref 0.450–4.500)

## 2017-11-06 ENCOUNTER — Encounter: Payer: Self-pay | Admitting: Obstetrics and Gynecology

## 2017-11-19 ENCOUNTER — Encounter: Payer: Self-pay | Admitting: Obstetrics and Gynecology

## 2017-11-19 NOTE — Telephone Encounter (Signed)
Please advise 

## 2017-11-25 ENCOUNTER — Other Ambulatory Visit: Payer: Self-pay | Admitting: Obstetrics and Gynecology

## 2017-11-25 DIAGNOSIS — J4520 Mild intermittent asthma, uncomplicated: Secondary | ICD-10-CM

## 2017-11-25 MED ORDER — FLUTICASONE PROPIONATE 50 MCG/ACT NA SUSP: 2.0000 | Freq: Every day | NASAL | 0 refills | Status: DC

## 2017-11-26 ENCOUNTER — Ambulatory Visit (INDEPENDENT_AMBULATORY_CARE_PROVIDER_SITE_OTHER): Payer: BC Managed Care – PPO | Admitting: Obstetrics and Gynecology

## 2017-11-26 ENCOUNTER — Encounter: Payer: Self-pay | Admitting: Obstetrics and Gynecology

## 2017-11-26 VITALS — BP 118/70 | Wt 157.0 lb

## 2017-11-26 DIAGNOSIS — N921 Excessive and frequent menstruation with irregular cycle: Secondary | ICD-10-CM

## 2017-11-26 DIAGNOSIS — Z30431 Encounter for routine checking of intrauterine contraceptive device: Secondary | ICD-10-CM

## 2017-11-26 DIAGNOSIS — E041 Nontoxic single thyroid nodule: Secondary | ICD-10-CM

## 2017-11-26 DIAGNOSIS — R454 Irritability and anger: Secondary | ICD-10-CM

## 2017-11-26 DIAGNOSIS — Z975 Presence of (intrauterine) contraceptive device: Secondary | ICD-10-CM

## 2017-11-26 NOTE — Patient Instructions (Signed)
I value your feedback and entrusting us with your care. If you get a [facility] patient survey, I would appreciate you taking the time to let us know about your experience today. Thank you! 

## 2017-11-26 NOTE — Progress Notes (Signed)
   Chief Complaint  Patient presents with  . Follow-up     History of Present Illness:  Kaitlin [name] is a 31 y.o. that had a Mirena IUD placed approximately 4 weeks ago. Since that time, she denies  pelvic pain,  vaginal d/c, heavy bleeding, dysmen. She has had light bleeding with occas "gushes" of blood daily since insertion. She was having daily bleeding with Paragard 10/28/17. U/S showed paragard in wrong location, so it was removed and mirena IUD was placed instead. Pt with hx of PCOS.   Pt also with complaints of daily moodiness/irritability. Had it prior to pregnancy, felt great first part of pregnancy, but sx are worse now. "just can't shake it". Feels tired but that's not new for her. She gets about 6-7 hrs of sleep a night. She has a 557 mo old baby, still breastfeeding exclusively. Had normal thyroid labs 11/18 but pt states she has a hx of thyroid nodules in ~2012 and hasn't had recent f/u. She also has hx of depression in the past and took zoloft, but she felt like a robot on it and refuses to take it again. Pt wonders about hormonal testing due to sx, but has hx of PCOS and is still breastfeeding.  She is interested in a ref to endocrinology for sx.   Review of Systems  Constitutional: Positive for fatigue. Negative for fever.  Gastrointestinal: Negative for blood in stool, constipation, diarrhea, nausea and vomiting.  Genitourinary: Negative for dyspareunia, dysuria, flank pain, frequency, hematuria, urgency, vaginal bleeding, vaginal discharge and vaginal pain.  Musculoskeletal: Negative for back pain.  Skin: Negative for rash.  Psychiatric/Behavioral: Positive for agitation and dysphoric mood. Negative for behavioral problems and confusion.    Physical Exam:  BP 118/70   Wt 157 lb (71.2 kg)   BMI 27.81 kg/m  Body mass index is 27.81 kg/m.  Pelvic exam:  Two IUD strings present seen coming from the cervical os. EGBUS, vaginal vault and cervix: within normal  limits   Assessment:  Routine checking of IUD Encounter for routine checking of intrauterine contraceptive device (IUD) - IUD strings in place.  Breakthrough bleeding with IUD - Reassurance. F/u prn.   Thyroid nodule - Check thyroid u/s. Will f/u with results. Had neg labs 11/18. - Plan: US THYROID  Irritability - Discussed poss etiologies of hormones PP and breastfeeding, thyroid, depression, sleep deprivation. Increase sleep/check u/s. If no change, question cyclic prog since not ovulating regularly with PCOS/breastfeeding. Pt declines SSRI tx.    Plan: F/u if any signs of infection or can no longer feel the strings.   Jamariyah Johannsen B. Junia Nygren, PA-C 11/26/2017 10:58 AM

## 2017-12-02 ENCOUNTER — Ambulatory Visit
Admission: RE | Admit: 2017-12-02 | Discharge: 2017-12-02 | Disposition: A | Discharge status: Home / Self Care | Payer: BC Managed Care – PPO | Source: Ambulatory Visit | Attending: Obstetrics and Gynecology | Admitting: Obstetrics and Gynecology

## 2017-12-02 DIAGNOSIS — E041 Nontoxic single thyroid nodule: Secondary | ICD-10-CM | POA: Diagnosis present

## 2018-04-26 DIAGNOSIS — E041 Nontoxic single thyroid nodule: Secondary | ICD-10-CM | POA: Insufficient documentation

## 2018-07-02 IMAGING — US US PELVIS COMPLETE
1 series · 13 of 25 positions shown · non-contrast
Comparison: None.

CLINICAL DATA: 30-year-old female, 15 date postpartum presenting
with vaginal bleeding and passing large blood clots.

EXAM:
TRANSABDOMINAL ULTRASOUND OF PELVIS
TECHNIQUE: Transabdominal ultrasound examination of the pelvis was performed
including evaluation of the uterus, ovaries, adnexal regions, and
pelvic cul-de-sac.

[Series 1: us pelvis complete · 0.21mm/px · 13 of 64 slices shown]
[im 1/64]
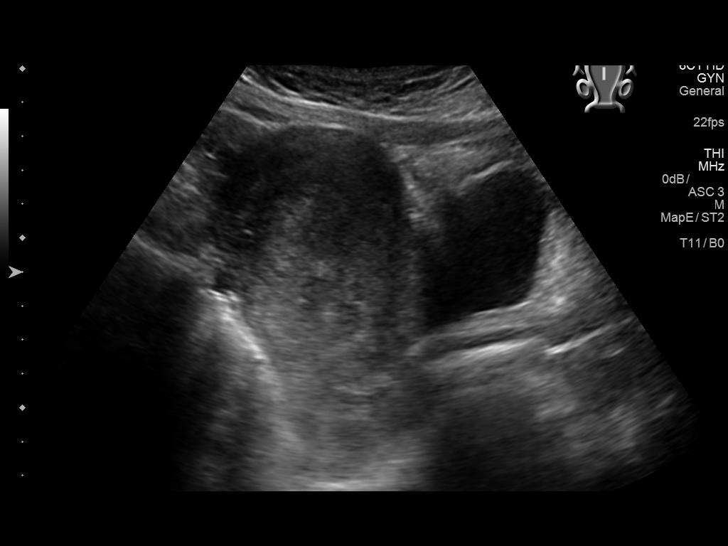
[im 6/64]
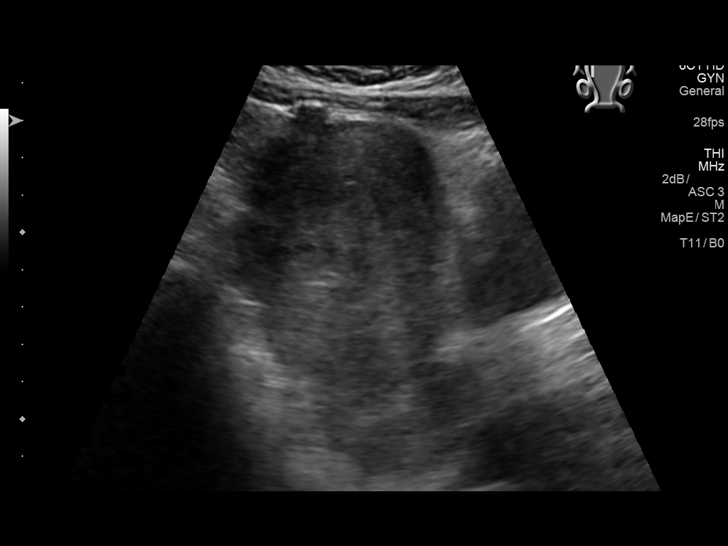
[im 11/64]
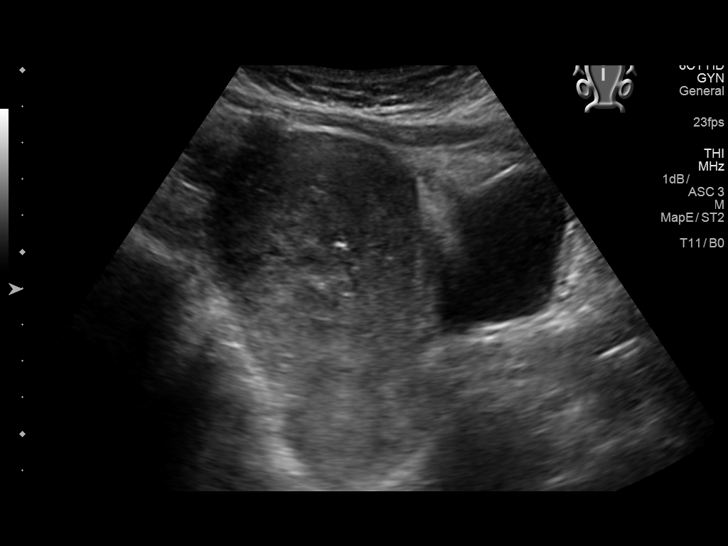
[im 16/64]
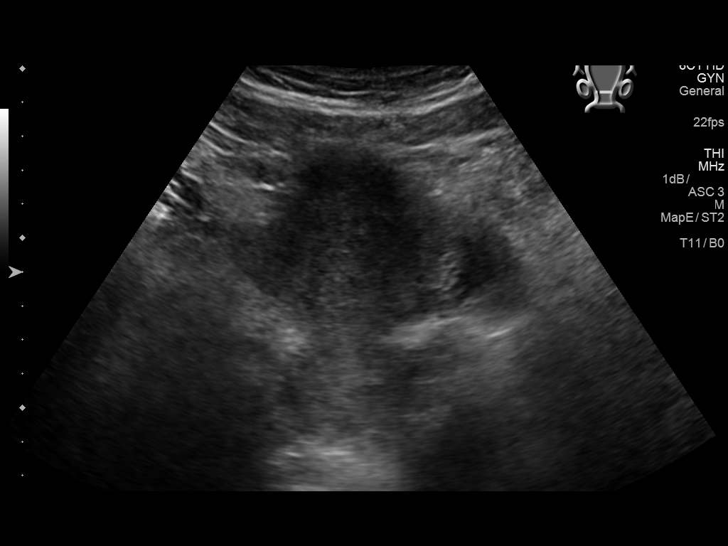
[im 22/64]
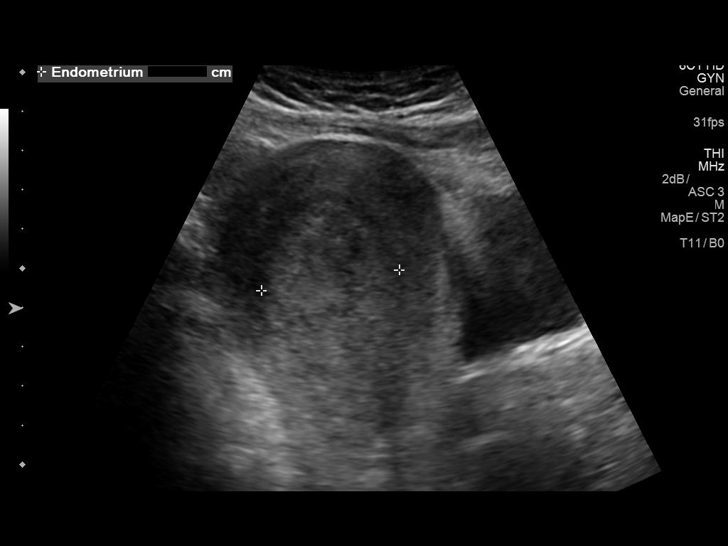
[im 27/64]
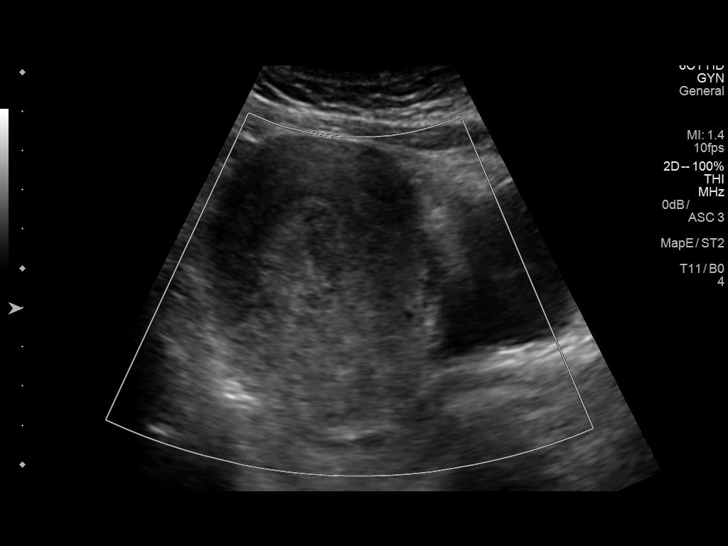
[im 32/64]
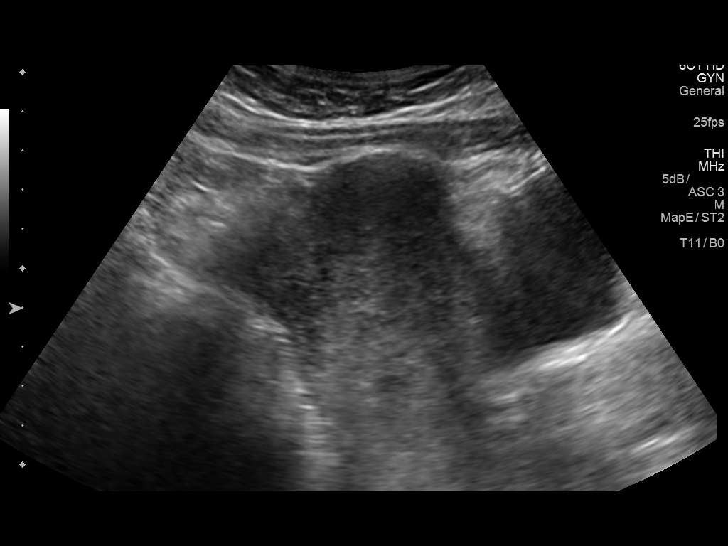
[im 37/64]
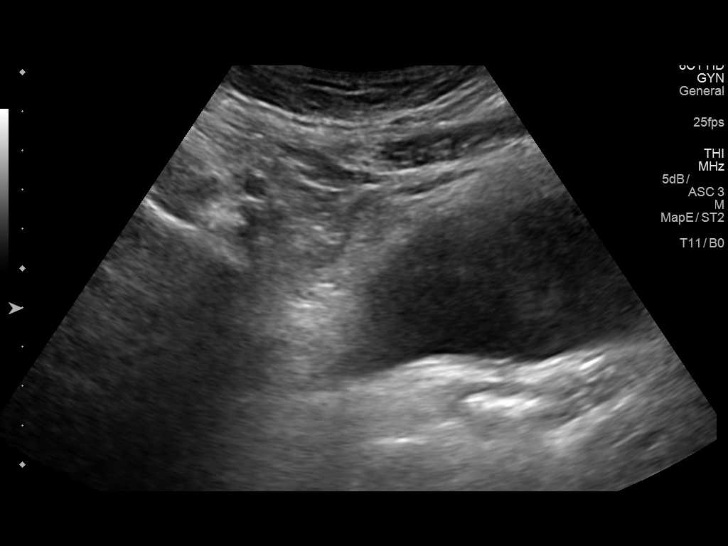
[im 43/64]
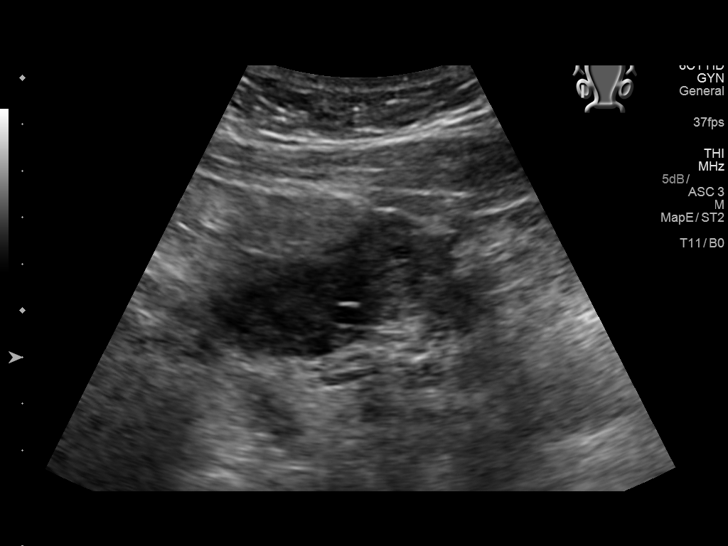
[im 48/64]
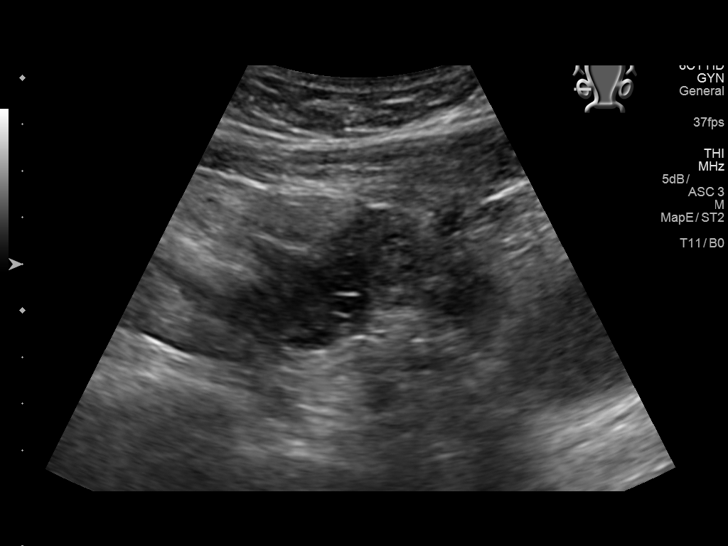
[im 53/64]
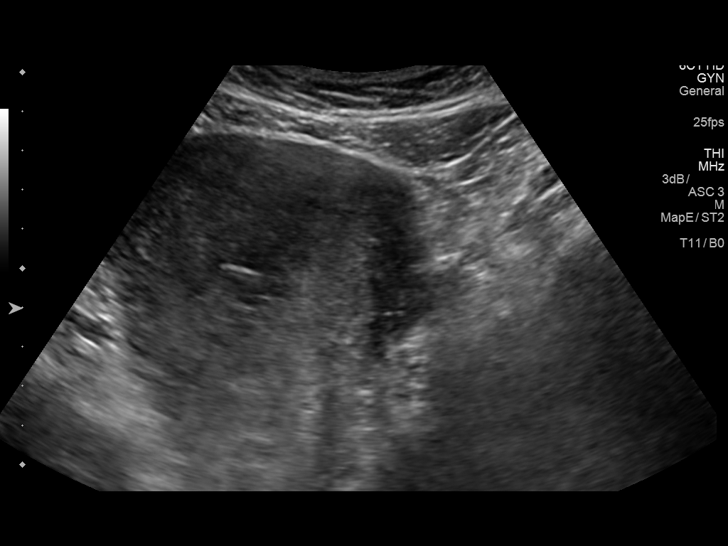
[im 58/64]
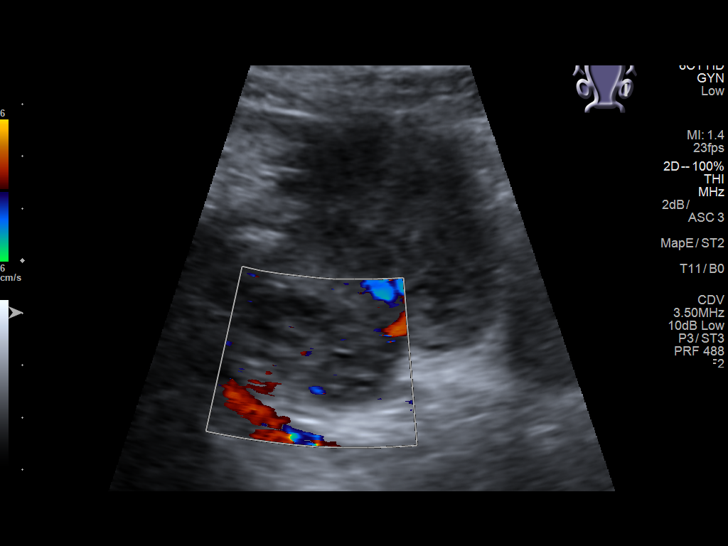
[im 64/64]
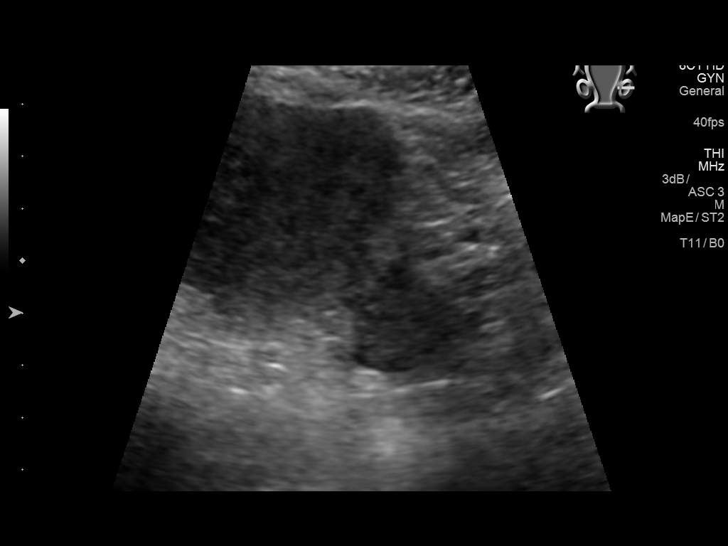

[13 of 25 positions shown; findings below may reference images not displayed]

FINDINGS: Uterus

Measurements: 10.5 x 5.7 x 7.4 cm.. There is a 1.3 x 1.0 x 1.2 cm
exophytic lesion arising from the uterine fundus most likely a
partially exophytic fibroid.

Endometrium

Thickness: The endometrium is thickened, heterogeneous and
echogenic. Color images demonstrate flow to the periphery of the
echogenic endometrial content. Although this may represent blood
products, findings concerning for retained products of conception.
Correlation with clinical exam and HCG levels and obstetrical
consult is advised.

Right ovary

Measurements: 3.2 x 2.2 x 2.0 cm. Normal appearance/no adnexal mass.

Left ovary

Measurements: 2.9 x 1.6 x 2.2 cm. Normal appearance/no adnexal mass.

Other findings:  No abnormal free fluid.
IMPRESSION: 1. Thickened and heterogeneous endometrium with peripheral flow
concerning for retained products of conception. Correlation with
clinical exam and HCG levels and obstetrical consult is advised.
2. Unremarkable ovaries.

## 2019-01-29 IMAGING — US US THYROID
1 series · 13 of 25 positions shown · non-contrast
Comparison: None.

CLINICAL DATA: Thyroid nodule

EXAM:
THYROID ULTRASOUND
TECHNIQUE: Ultrasound examination of the thyroid gland and adjacent soft
tissues was performed.

[Series 1: us thyroid · 0.07mm/px · 13 of 49 slices shown]
[im 1/49]
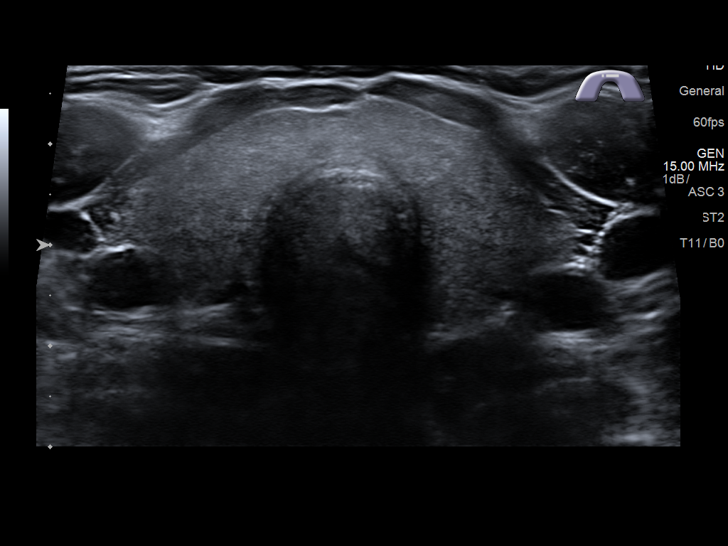
[im 5/49]
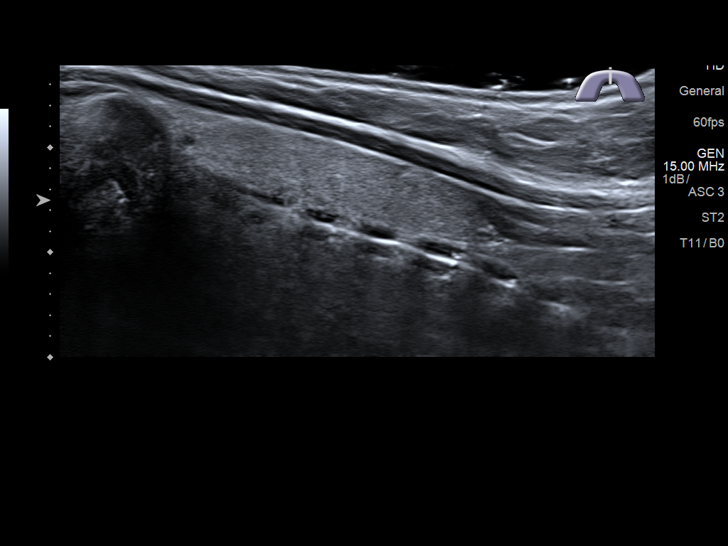
[im 9/49]
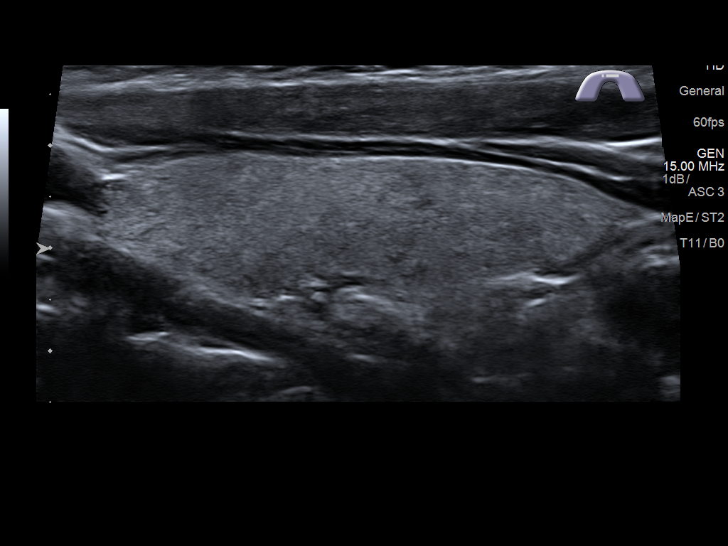
[im 13/49]
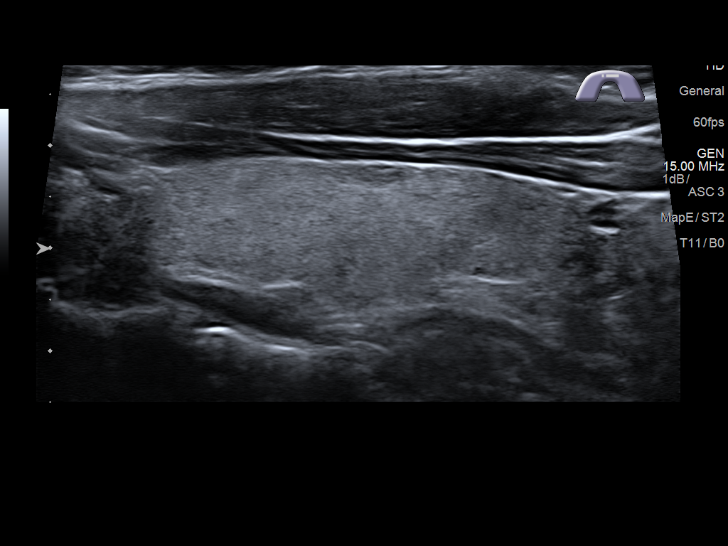
[im 17/49]
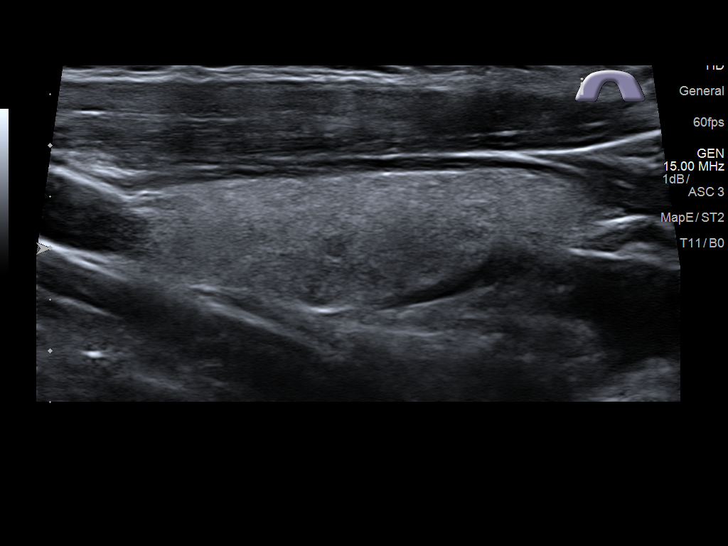
[im 21/49]
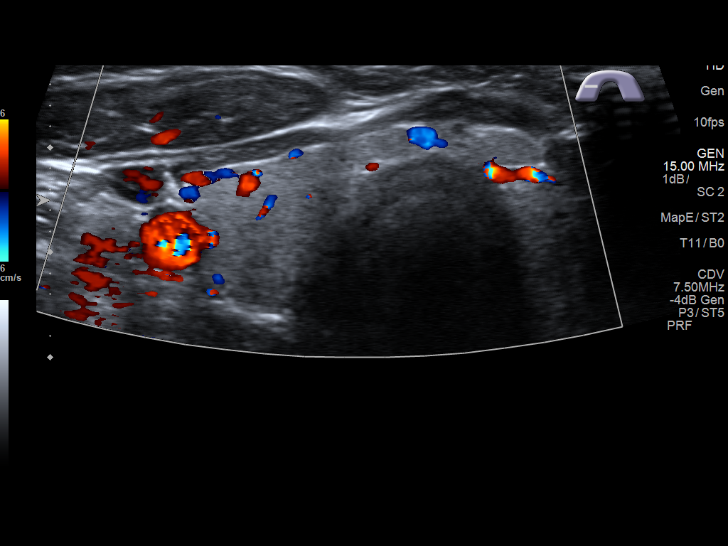
[im 25/49]
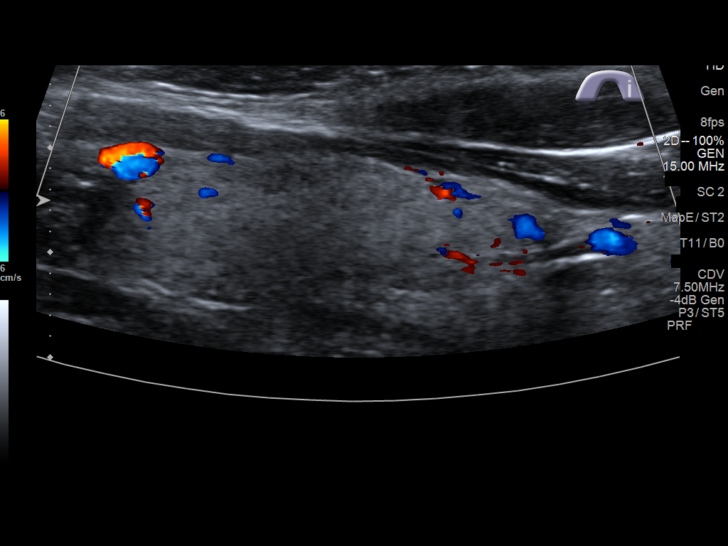
[im 29/49]
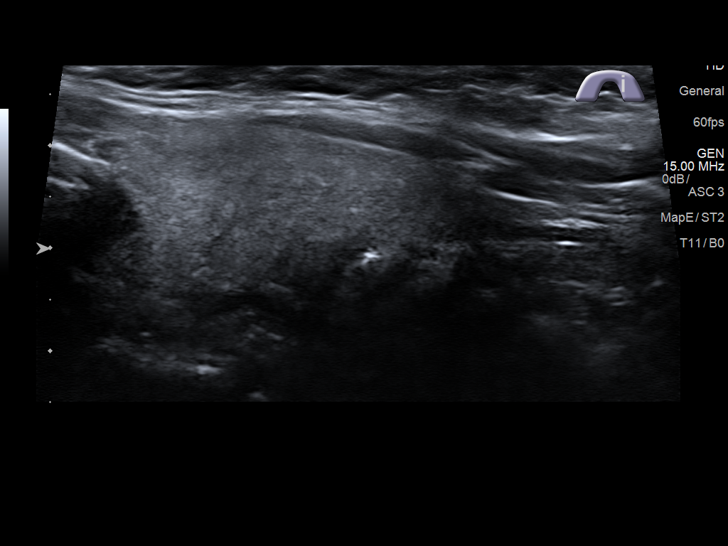
[im 33/49]
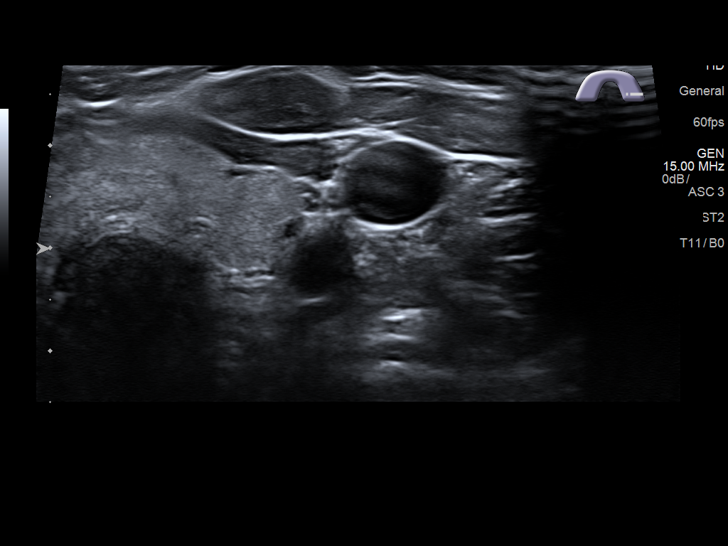
[im 37/49]
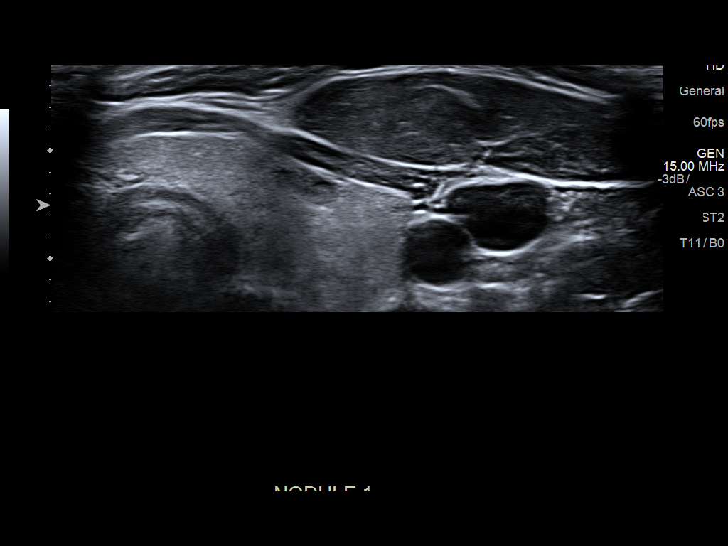
[im 41/49]
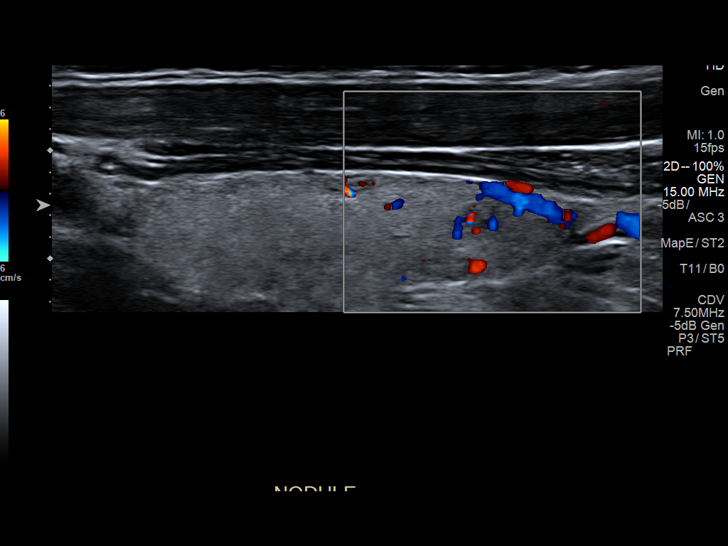
[im 45/49]
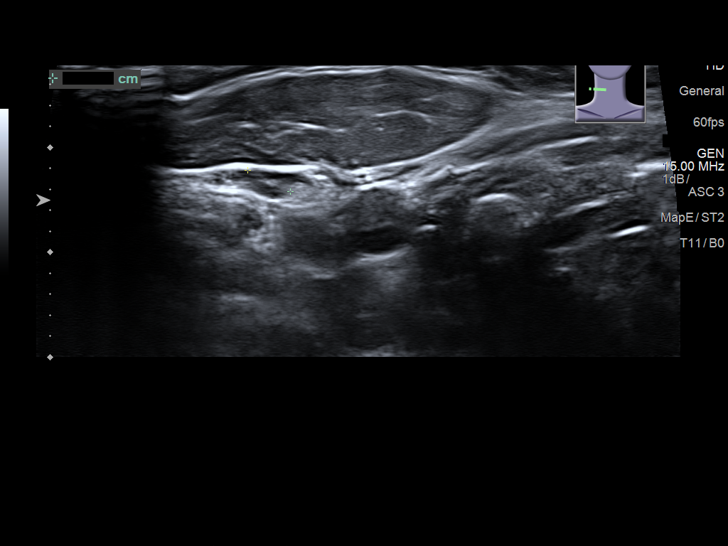
[im 49/49]
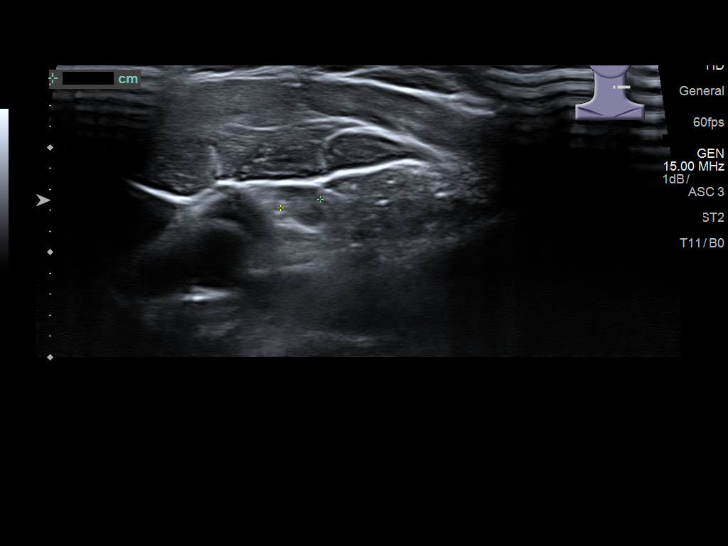

[13 of 25 positions shown; findings below may reference images not displayed]

FINDINGS: Parenchymal Echotexture: Normal

Isthmus: 7 mm

Right lobe: 5.0 x 1.5 x 1.5 cm

Left lobe: 4.4 x 1.5 x 1.7 cm

_________________________________________________________

Estimated total number of nodules >/= 1 cm: 0

Number of spongiform nodules >/=  2 cm not described below (TR1): 0

Number of mixed cystic and solid nodules >/= 1.5 cm not described
below (TR2): 0

_________________________________________________________

Nodule # 1:

Location: Left; Superior

Maximum size: 0.8 cm; Other 2 dimensions: 0.7 x 0.3 cm

Composition: mixed cystic and solid (1)

Echogenicity: isoechoic (1)

Shape: not taller-than-wide (0)

Margins: ill-defined (0)

Echogenic foci: none (0)

ACR TI-RADS total points: 2.

ACR TI-RADS risk category: TR2 (2 points).

ACR TI-RADS recommendations:

This nodule does NOT meet TI-RADS criteria for biopsy or dedicated
follow-up.

_________________________________________________________

No adenopathy
IMPRESSION: 0.8 cm left superior thyroid TR 2 nodule does not meet criteria for
biopsy or follow-up. No other significant thyroid abnormality.

The above is in keeping with the ACR TI-RADS recommendations - [HOSPITAL] 9266;[DATE].

## 2020-03-13 ENCOUNTER — Other Ambulatory Visit: Payer: Self-pay | Admitting: Dermatology

## 2020-03-26 LAB — FETAL NONSTRESS TEST

## 2020-06-04 ENCOUNTER — Other Ambulatory Visit: Payer: Self-pay | Admitting: *Deleted

## 2020-06-04 DIAGNOSIS — N390 Urinary tract infection, site not specified: Secondary | ICD-10-CM

## 2020-06-04 NOTE — Addendum Note (Signed)
Addended by: Sueanne Margarita on: 06/04/2020 03:22 PM   Modules accepted: Orders

## 2020-06-05 ENCOUNTER — Ambulatory Visit: Payer: BC Managed Care – PPO | Admitting: Urology

## 2020-06-05 ENCOUNTER — Other Ambulatory Visit
Admission: RE | Admit: 2020-06-05 | Discharge: 2020-06-05 | Disposition: A | Discharge status: Home / Self Care | Payer: BC Managed Care – PPO | Attending: Urology | Admitting: Urology

## 2020-06-05 ENCOUNTER — Encounter: Payer: Self-pay | Admitting: Urology

## 2020-06-05 ENCOUNTER — Other Ambulatory Visit: Payer: Self-pay

## 2020-06-05 VITALS — BP 121/78 | HR 91 | Ht 63.0 in | Wt 156.0 lb

## 2020-06-05 DIAGNOSIS — N390 Urinary tract infection, site not specified: Secondary | ICD-10-CM | POA: Diagnosis present

## 2020-06-05 DIAGNOSIS — R102 Pelvic and perineal pain: Secondary | ICD-10-CM

## 2020-06-05 DIAGNOSIS — N3010 Interstitial cystitis (chronic) without hematuria: Secondary | ICD-10-CM | POA: Diagnosis not present

## 2020-06-05 LAB — URINALYSIS, COMPLETE (UACMP) WITH MICROSCOPIC
Bilirubin Urine: NEGATIVE
Glucose, UA: NEGATIVE mg/dL
Ketones, ur: NEGATIVE mg/dL
Nitrite: NEGATIVE
Protein, ur: NEGATIVE mg/dL
Specific Gravity, Urine: 1.025 (ref 1.005–1.030)
pH: 5.5 (ref 5.0–8.0)

## 2020-06-05 NOTE — Patient Instructions (Addendum)
Take Cranberry tablets daily to prevent infection Urinate every 2-3 hours during the day to prevent infection  Urinary Tract Infection, Adult  A urinary tract infection (UTI) is an infection of any part of the urinary tract. The urinary tract includes the kidneys, ureters, bladder, and urethra. These organs make, store, and get rid of urine in the body. Your health care provider may use other names to describe the infection. An upper UTI affects the ureters and kidneys (pyelonephritis). A lower UTI affects the bladder (cystitis) and urethra (urethritis). What are the causes? Most urinary tract infections are caused by bacteria in your genital area, around the entrance to your urinary tract (urethra). These bacteria grow and cause inflammation of your urinary tract. What increases the risk? You are more likely to develop this condition if:  You have a urinary catheter that stays in place (indwelling).  You are not able to control when you urinate or have a bowel movement (you have incontinence).  You are female and you: ? Use a spermicide or diaphragm for birth control. ? Have low estrogen levels. ? Are pregnant.  You have certain genes that increase your risk (genetics).  You are sexually active.  You take antibiotic medicines.  You have a condition that causes your flow of urine to slow down, such as: ? An enlarged prostate, if you are female. ? Blockage in your urethra (stricture). ? A kidney stone. ? A nerve condition that affects your bladder control (neurogenic bladder). ? Not getting enough to drink, or not urinating often.  You have certain medical conditions, such as: ? Diabetes. ? A weak disease-fighting system (immunesystem). ? Sickle cell disease. ? Gout. ? Spinal cord injury. What are the signs or symptoms? Symptoms of this condition include:  Needing to urinate right away (urgently).  Frequent urination or passing small amounts of urine frequently.  Pain or  burning with urination.  Blood in the urine.  Urine that smells bad or unusual.  Trouble urinating.  Cloudy urine.  Vaginal discharge, if you are female.  Pain in the abdomen or the lower back. You may also have:  Vomiting or a decreased appetite.  Confusion.  Irritability or tiredness.  A fever.  Diarrhea. The first symptom in older adults may be confusion. In some cases, they may not have any symptoms until the infection has worsened. How is this diagnosed? This condition is diagnosed based on your medical history and a physical exam. You may also have other tests, including:  Urine tests.  Blood tests.  Tests for sexually transmitted infections (STIs). If you have had more than one UTI, a cystoscopy or imaging studies may be done to determine the cause of the infections. How is this treated? Treatment for this condition includes:  Antibiotic medicine.  Over-the-counter medicines to treat discomfort.  Drinking enough water to stay hydrated. If you have frequent infections or have other conditions such as a kidney stone, you may need to see a health care provider who specializes in the urinary tract (urologist). In rare cases, urinary tract infections can cause sepsis. Sepsis is a life-threatening condition that occurs when the body responds to an infection. Sepsis is treated in the hospital with IV antibiotics, fluids, and other medicines. Follow these instructions at home:  Medicines  Take over-the-counter and prescription medicines only as told by your health care provider.  If you were prescribed an antibiotic medicine, take it as told by your health care provider. Do not stop using the antibiotic even  if you start to feel better. General instructions  Make sure you: ? Empty your bladder often and completely. Do not hold urine for long periods of time. ? Empty your bladder after sex. ? Wipe from front to back after a bowel movement if you are female. Use  each tissue one time when you wipe.  Drink enough fluid to keep your urine pale yellow.  Keep all follow-up visits as told by your health care provider. This is important. Contact a health care provider if:  Your symptoms do not get better after 1-2 days.  Your symptoms go away and then return. Get help right away if you have:  Severe pain in your back or your lower abdomen.  A fever.  Nausea or vomiting. Summary  A urinary tract infection (UTI) is an infection of any part of the urinary tract, which includes the kidneys, ureters, bladder, and urethra.  Most urinary tract infections are caused by bacteria in your genital area, around the entrance to your urinary tract (urethra).  Treatment for this condition often includes antibiotic medicines.  If you were prescribed an antibiotic medicine, take it as told by your health care provider. Do not stop using the antibiotic even if you start to feel better.  Keep all follow-up visits as told by your health care provider. This is important. This information is not intended to replace advice given to you by your health care provider. Make sure you discuss any questions you have with your health care provider. Document Revised: 11/11/2018 Document Reviewed: 06/03/2018 Elsevier Patient Education  2020 [employer]   Interstitial Cystitis  Interstitial cystitis is inflammation of the bladder. This may cause pain in the bladder area as well as a frequent and urgent need to urinate. The bladder is a hollow organ in the lower part of the abdomen. It stores urine after the urine is made in the kidneys. The severity of interstitial cystitis can vary from person to person. You may have flare-ups, and then your symptoms may go away for a while. For many people, it becomes a long-term (chronic) problem. What are the causes? The cause of this condition is not known. What increases the risk? The following factors may make you more likely to  develop this condition:  You are female.  You have fibromyalgia.  You have irritable bowel syndrome (IBS).  You have endometriosis. This condition may be aggravated by:  Stress.  Smoking.  Spicy foods. What are the signs or symptoms? Symptoms of interstitial cystitis vary, and they can change over time. Symptoms may include:  Discomfort or pain in the bladder area, which is in the lower abdomen. Pain can range from mild to severe. The pain may change in intensity as the bladder fills with urine or as it empties.  Pain in the pelvic area, between the hip bones.  An urgent need to urinate.  Frequent urination.  Pain during urination.  Pain during sex.  Blood in the urine. For women, symptoms often get worse during menstruation. How is this diagnosed? This condition is diagnosed based on your symptoms, your medical history, and a physical exam. You may have tests to rule out other conditions, such as:  Urine tests.  Cystoscopy. For this test, a tool similar to a very thin telescope is used to look into your bladder.  Biopsy. This involves taking a sample of tissue from the bladder to be examined under a microscope. How is this treated? There is no cure for this condition,  but treatment can help you control your symptoms. Work closely with your health care provider to find the most effective treatments for you. Treatment options may include:  Medicines to relieve pain and reduce how often you feel the need to urinate.  Learning ways to control when you urinate (bladder training).  Lifestyle changes, such as changing your diet or taking steps to control stress.  Using a device that provides electrical stimulation to your nerves, which can relieve pain (neuromodulation therapy). The device is placed on your back, where it blocks the nerves that cause you to feel pain in your bladder area.  A procedure that stretches your bladder by filling it with air or  fluid.  Surgery. This is rare. It is only done for extreme cases, if other treatments do not help. Follow these instructions at home: Bladder training   Use bladder training techniques as directed. Techniques may include: ? Urinating at scheduled times. ? Training yourself to delay urination. ? Doing exercises (Kegel exercises) to strengthen the muscles that control urine flow.  Keep a bladder diary. ? Write down the times that you urinate and any symptoms that you have. This can help you find out which foods, liquids, or activities make your symptoms worse. ? Use your bladder diary to schedule bathroom trips. If you are away from home, plan to be near a bathroom at each of your scheduled times.  Make sure that you urinate just before you leave the house and just before you go to bed. Eating and drinking  Make dietary changes as recommended by your health care provider. You may need to avoid: ? Spicy foods. ? Foods that contain a lot of potassium.  Limit your intake of beverages that make you need to urinate. These include: ? Caffeinated beverages like soda, coffee, and tea. ? Alcohol. General instructions  Take over-the-counter and prescription medicines only as told by your health care provider.  Do not drink alcohol.  You can try a warm or cool compress over your bladder for comfort.  Avoid wearing tight clothing.  Do not use any products that contain nicotine or tobacco, such as cigarettes and e-cigarettes. If you need help quitting, ask your health care provider.  Keep all follow-up visits as told by your health care provider. This is important. Contact a health care provider if you have:  Symptoms that do not get better with treatment.  Pain or discomfort that gets worse.  More frequent urges to urinate.  A fever. Get help right away if:  You have no control over when you urinate. Summary  Interstitial cystitis is inflammation of the bladder.  This  condition may cause pain in the bladder area as well as a frequent and urgent need to urinate.  You may have flare-ups of the condition, and then it may go away for a while. For many people, it becomes a long-term (chronic) problem.  There is no cure for interstitial cystitis, but treatment methods are available to control your symptoms. This information is not intended to replace advice given to you by your health care provider. Make sure you discuss any questions you have with your health care provider. Document Revised: 11/06/2017 Document Reviewed: 10/19/2017 Elsevier Patient Education  2020 [employer].

## 2020-06-05 NOTE — Progress Notes (Signed)
06/05/20 9:37 AM   Elmo Putt 1986-03-15 [id number]  CC: rUTIs, pelvic pain, hematuria  HPI: I saw Ms. Fairbairn in urology clinic today for evaluation of the above issues.  She is a 34 year old female with history of anxiety to and depression who presents with history of recurrent UTIs and pelvic pain, and recent severe UTI with gross hematuria.  She reportedly was followed by an outside urogynecologist in the past and had been diagnosed with interstitial cystitis.  She had previously been treated with Elmiron, as well as intravesical instillation of lidocaine and heparin.  She did not tolerate the lidocaine/heparin instillation well with severe dizziness and systemic symptoms.  She also had been on Macrobid prophylaxis in the past.  After she had her child in 2018 she reports her urinary symptoms and recurrent UTIs in a most completely resolved.  She presented to her gynecologist on 6/for severe urinary symptoms and culture ultimately grew >100k Klebsiella aeogenes.  This ultimately returned multidrug-resistant.  She was originally treated with Macrobid which did not improve her symptoms(Macrobid with intermediate sensitivity), then treated with a 7-day course of Keflex(culture was resistant to cefazolin).  She reports she has had some improvement in her urinary symptoms, but they have persisted with some pelvic discomfort and burning, as well as passage of tissue.  Urine culture 6/18 was negative.  Urinalysis today is concerning for persistent infection with 11-20 WBCs, 6-10 RBCs, rare bacteria, trace leukocytes.  She also reports a low-grade temperature of 99.5 within the last few days as well as some chills overnight.  She does think these are associated with sexual activity.  She denies any history of kidney stones.  She has had trouble with antibiotics in the past with diarrhea and GI side effects.  She does feel that citrus and tomatoes/ketchup aggravate her bladder  symptoms.  PMH: Past Medical History:  Diagnosis Date  . Anxiety and depression   . Asthma   . Eczema   . Interstitial cystitis   . PCOS (polycystic ovarian syndrome)   . Premature labor before 37 weeks of gestation with delivery, delivered    PPROM at 34 weeks    Surgical History: Past Surgical History:  Procedure Laterality Date  . Excision of lymph node  02/24/2013   sublingual lymph node: Dr Gertie Baron  . INTRAUTERINE DEVICE INSERTION  06/01/2017   [facility]  . WISDOM TOOTH EXTRACTION      Family History: Family History  Problem Relation Age of Onset  . Multiple sclerosis Mother   . Breast cancer Maternal Grandmother   . Hypertension Maternal Grandfather   . Thyroid cancer Maternal Grandfather   . Lung cancer Paternal Grandmother   . Hypertension Paternal Grandmother   . Diabetes Mellitus II Paternal Grandfather   . Hypertension Paternal Grandfather     Social History:  reports that she has quit smoking. She has never used smokeless tobacco. She reports that she does not drink alcohol and does not use drugs.  Physical Exam: BP 121/78   Pulse 91   Ht 5\' 3"  (1.6 m)   Wt 156 lb (70.8 kg)   BMI 27.63 kg/m    Constitutional:  Alert and oriented, No acute distress. Cardiovascular: No clubbing, cyanosis, or edema. Respiratory: Normal respiratory effort, no increased work of breathing. GI: Abdomen is soft, nontender, nondistended, no abdominal masses  Laboratory Data: Culture history reviewed in care everywhere  Pertinent Imaging: None to review  Assessment & Plan:   In summary, she is a 34 year old  female with a history of recurrent UTIs and diagnosis of interstitial cystitis, who presents with persistent urinary symptoms after having a Klebsiella UTI on 05/11/2020.  She has not completed a course of culture appropriate antibiotics.  We had a long conversation today about the overlap between recurrent UTIs, pelvic pain, and interstitial cystitis.  We  discussed the complexities of pelvic pain and possible range of etiologies including recurrent UTI's, pelvic floor dysfunction, chronic bladder pain syndrome, and interstitial cystitis.  We reviewed the AUA guidelines that recommend an algorithmic approach to treatment for these patients, and that a trial of different medications and strategies is sometimes needed to find the approach that works best for each patient's unique situation.  I reinforced the importance of stress management, relaxation, avoiding triggers, and pain management in the approach to pelvic pain.  We discussed the evaluation and treatment of patients with recurrent UTIs at length.  We specifically discussed the differences between asymptomatic bacteriuria and true urinary tract infection.  We discussed the AUA definition of recurrent UTI of at least 2 culture proven symptomatic acute cystitis episodes in a 2-month period, or 3 within a 1 year period.  We discussed the importance of culture directed antibiotic treatment, and antibiotic stewardship.  First-line therapy includes nitrofurantoin(5 days), Bactrim(3 days), or fosfomycin(3 g single dose).  Possible etiologies of recurrent infection include periurethral tissue atrophy in postmenopausal woman, constipation, sexual activity, incomplete emptying, anatomic abnormalities, and even genetic predisposition.  Finally, we discussed the role of perineal hygiene, timed voiding, adequate hydration, topical vaginal estrogen, cranberry prophylaxis, and low-dose antibiotic prophylaxis.  Cipro 500 mg twice daily x5 days for Klebsiella UTI with multidrug resistances Cranberry tablets daily for UTI prevention 1 week course of scheduled NSAIDs for persistent pelvic pain Renal ultrasound to rule out hydronephrosis, nephrolithiasis, or any bladder lesions RTC 2 months for symptom check-consider postcoital prophylaxis at that time if persistent infections Consider amitriptyline in the future if  persistent bladder symptoms and negative urine cultures  I spent 60 total minutes on the day of the encounter including pre-visit review of the medical record, face-to-face time with the patient, and post visit ordering of labs/imaging/tests.  Legrand Rams, MD 06/05/2020  [facility] [address] [place], [place] [address] [phone]

## 2020-06-07 ENCOUNTER — Telehealth: Payer: Self-pay

## 2020-06-07 LAB — URINE CULTURE: Culture: 80000 — AB

## 2020-06-07 NOTE — Telephone Encounter (Signed)
-----   Message from Sondra Come, MD sent at 06/07/2020  9:31 AM EDT ----- UTI confirmed on culture, on appropriate cipro, anticipate she will improve quickly. Keep follow up as scheduled  Legrand Rams, MD 06/07/2020

## 2020-06-07 NOTE — Telephone Encounter (Signed)
Called pt informed her of the information below. Pt gave verbal understanding.  

## 2020-06-19 ENCOUNTER — Other Ambulatory Visit: Payer: Self-pay

## 2020-06-19 ENCOUNTER — Ambulatory Visit
Admission: RE | Admit: 2020-06-19 | Discharge: 2020-06-19 | Disposition: A | Discharge status: Home / Self Care | Payer: BC Managed Care – PPO | Source: Ambulatory Visit | Attending: Urology | Admitting: Urology

## 2020-06-19 DIAGNOSIS — N390 Urinary tract infection, site not specified: Secondary | ICD-10-CM

## 2020-06-20 ENCOUNTER — Telehealth: Payer: Self-pay

## 2020-06-20 NOTE — Telephone Encounter (Signed)
-----   Message from Sondra Come, MD sent at 06/19/2020  1:56 PM EDT ----- Renal and bladder US normal, keep follow up as scheduled  Legrand Rams, MD 06/19/2020

## 2020-06-20 NOTE — Telephone Encounter (Signed)
Called pt no answer. Left detailed message for pt per DPR. Advised pt to call back for questions or concerns.  

## 2020-08-07 ENCOUNTER — Ambulatory Visit: Payer: BC Managed Care – PPO | Admitting: Urology

## 2020-08-23 ENCOUNTER — Telehealth: Payer: Self-pay

## 2020-08-23 NOTE — Telephone Encounter (Signed)
Pt calling; 4 1/2 wks preg; spotting; Nob 10/11th.  [phone]  Pt states it is just with wiping; had a hard BM and thinks that helped to push it out; when she last voided it was much less blood with wiping; no IC 24hrs before spotting started.  Adv nothing to do now but to monitor it; if becomes like a period to go to the ED.

## 2020-09-08 ENCOUNTER — Other Ambulatory Visit: Payer: Self-pay | Admitting: Dermatology

## 2020-09-17 ENCOUNTER — Other Ambulatory Visit: Payer: Self-pay

## 2020-09-17 ENCOUNTER — Other Ambulatory Visit (HOSPITAL_COMMUNITY)
Admission: RE | Admit: 2020-09-17 | Discharge: 2020-09-17 | Disposition: A | Discharge status: Home / Self Care | Payer: BC Managed Care – PPO | Source: Ambulatory Visit | Attending: Obstetrics and Gynecology | Admitting: Obstetrics and Gynecology

## 2020-09-17 ENCOUNTER — Encounter: Payer: Self-pay | Admitting: Obstetrics and Gynecology

## 2020-09-17 ENCOUNTER — Ambulatory Visit (INDEPENDENT_AMBULATORY_CARE_PROVIDER_SITE_OTHER): Payer: BC Managed Care – PPO | Admitting: Obstetrics and Gynecology

## 2020-09-17 VITALS — BP 108/70 | Wt 162.0 lb

## 2020-09-17 DIAGNOSIS — O09899 Supervision of other high risk pregnancies, unspecified trimester: Secondary | ICD-10-CM | POA: Insufficient documentation

## 2020-09-17 DIAGNOSIS — Z113 Encounter for screening for infections with a predominantly sexual mode of transmission: Secondary | ICD-10-CM | POA: Diagnosis present

## 2020-09-17 DIAGNOSIS — O0990 Supervision of high risk pregnancy, unspecified, unspecified trimester: Secondary | ICD-10-CM | POA: Insufficient documentation

## 2020-09-17 DIAGNOSIS — N912 Amenorrhea, unspecified: Secondary | ICD-10-CM

## 2020-09-17 DIAGNOSIS — Z124 Encounter for screening for malignant neoplasm of cervix: Secondary | ICD-10-CM | POA: Insufficient documentation

## 2020-09-17 DIAGNOSIS — Z31438 Encounter for other genetic testing of female for procreative management: Secondary | ICD-10-CM

## 2020-09-17 DIAGNOSIS — Z3A08 8 weeks gestation of pregnancy: Secondary | ICD-10-CM

## 2020-09-17 DIAGNOSIS — Z3689 Encounter for other specified antenatal screening: Secondary | ICD-10-CM

## 2020-09-17 DIAGNOSIS — Z23 Encounter for immunization: Secondary | ICD-10-CM | POA: Diagnosis not present

## 2020-09-17 LAB — OB RESULTS CONSOLE VARICELLA ZOSTER ANTIBODY, IGG: Varicella: IMMUNE

## 2020-09-17 LAB — POCT URINE PREGNANCY: Preg Test, Ur: POSITIVE — AB

## 2020-09-17 LAB — OB RESULTS CONSOLE GC/CHLAMYDIA: Gonorrhea: NEGATIVE

## 2020-09-17 NOTE — Progress Notes (Signed)
New Obstetric Patient H&P    Chief Complaint: "Desires prenatal care"   History of Present Illness: Patient is a 34 y.o. G30P0101 Hispanic or Latino female, presents with amenorrhea and positive home pregnancy test. Patient's last menstrual period was 07/20/2020. and based on her  LMP, her EDD is Estimated Date of Delivery: 04/26/21 and her EGA is [redacted]w[redacted]d. Cycles are regular monthly.   Since her LMP she claims she has experienced fatigue, breast tenderness, mild nausea. She did have some light vaginal bleeding earlier in the pregnancy none currently. Her past medical history is noncontributory. Her prior pregnancies are notable for none  Since her LMP, she admits to the use of tobacco products  no She claims she has gained   no pounds since the start of her pregnancy.  There are cats in the home in the home  yes indoor/outdoor She admits close contact with children on a regular basis  yes  She has had chicken pox in the past yes She has had Tuberculosis exposures, symptoms, or previously tested positive for TB   no Current or past history of domestic violence. no  Genetic Screening/Teratology Counseling: (Includes patient, baby's father, or anyone in either family with:)   1. Patient's age >/= 55 at [facility]  no 2. Thalassemia ([place], [place], Mediterranean, or Asian background): MCV<80  no 3. Neural tube defect (meningomyelocele, spina bifida, anencephaly)  no 4. Congenital heart defect  no  5. Down syndrome  no 6. Tay-Sachs (Jewish, [place]  no 7. Canavan's Disease  no 8. Sickle cell disease or trait (African)  no  9. Hemophilia or other blood disorders  no  10. Muscular dystrophy  no  11. Cystic fibrosis  no  12. Huntington's Chorea  no  13. Mental retardation/autism  yes 14. Other inherited genetic or chromosomal disorder  no 15. Maternal metabolic disorder (DM, PKU, etc)  no 16. Patient or FOB with a child with a birth defect not listed above no  16a. Patient or FOB with a  birth defect themselves no 17. Recurrent pregnancy loss, or stillbirth  no  18. Any medications since LMP other than prenatal vitamins (include vitamins, supplements, OTC meds, drugs, alcohol)  no 19. Any other genetic/environmental exposure to discuss  no  Infection History:   1. Lives with someone with TB or TB exposed  no  2. Patient or partner has history of genital herpes  no 3. Rash or viral illness since LMP  no 4. History of STI (GC, CT, HPV, syphilis, HIV)  no 5. History of recent travel :  no  Other pertinent information:  no     Review of Systems:10 point review of systems negative unless otherwise noted in HPI  Past Medical History:  Patient Active Problem List   Diagnosis Date Noted  . Preterm delivery, delivered 04/22/2017  . Interstitial cystitis 03/12/2017  . Allergic rhinitis 02/26/2015  . Anxiety disorder 02/26/2015  . Chronic UTI 02/26/2015  . Eczema 02/26/2015  . Intermittent asthma, uncomplicated 02/26/2015  . PCOS (polycystic ovarian syndrome) 02/26/2015  . Depression 02/18/2012    Past Surgical History:  Past Surgical History:  Procedure Laterality Date  . Excision of lymph node  02/24/2013   sublingual lymph node: Dr Gertie Baron  . INTRAUTERINE DEVICE INSERTION  06/01/2017   [facility]  . WISDOM TOOTH EXTRACTION      Gynecologic History: Patient's last menstrual period was 07/20/2020.  Obstetric History: G2P0101  Family History:  Family History  Problem Relation Age of  Onset  . Multiple sclerosis Mother   . Breast cancer Maternal Grandmother        65s  . Hypertension Maternal Grandfather   . Thyroid cancer Maternal Grandfather   . Lung cancer Paternal Grandmother   . Hypertension Paternal Grandmother   . Diabetes Mellitus II Paternal Grandfather   . Hypertension Paternal Grandfather     Social History:  Social History   Socioeconomic History  . Marital status: Married    Spouse name: Not on file  . Number of children: 1  .  Years of education: Not on file  . Highest education level: Not on file  Occupational History  . Not on file  Tobacco Use  . Smoking status: Former Games developer  . Smokeless tobacco: Never Used  Vaping Use  . Vaping Use: Never used  Substance and Sexual Activity  . Alcohol use: No  . Drug use: No  . Sexual activity: Yes    Partners: Male    Birth control/protection: None  Other Topics Concern  . Not on file  Social History Narrative  . Not on file   Social Determinants of Health   Financial Resource Strain:   . Difficulty of Paying Living Expenses: Not on file  Food Insecurity:   . Worried About Programme researcher, broadcasting/film/video in the Last Year: Not on file  . Ran Out of Food in the Last Year: Not on file  Transportation Needs:   . Lack of Transportation (Medical): Not on file  . Lack of Transportation (Non-Medical): Not on file  Physical Activity:   . Days of Exercise per Week: Not on file  . Minutes of Exercise per Session: Not on file  Stress:   . Feeling of Stress : Not on file  Social Connections:   . Frequency of Communication with Friends and Family: Not on file  . Frequency of Social Gatherings with Friends and Family: Not on file  . Attends Religious Services: Not on file  . Active Member of Clubs or Organizations: Not on file  . Attends Banker Meetings: Not on file  . Marital Status: Not on file  Intimate Partner Violence:   . Fear of Current or Ex-Partner: Not on file  . Emotionally Abused: Not on file  . Physically Abused: Not on file  . Sexually Abused: Not on file    Allergies:  Allergies  Allergen Reactions  . Sulfa Antibiotics Shortness Of Breath  . Sulfamethoxazole-Trimethoprim Shortness Of Breath  . Eggs Or Egg-Derived Products Hives  . Latex Hives  . Metronidazole Hives    Medications: Prior to Admission medications   Medication Sig Start Date End Date Taking? Authorizing Provider  Cholecalciferol (VITAMIN D3) 2000 units capsule Take by  mouth. 02/14/11  Yes [provider]  prenatal vitamin w/FE, FA (PRENATAL 1 + 1) 27-1 MG TABS tablet Take 1 tablet by mouth daily at 12 noon.   Yes [provider]  albuterol (VENTOLIN HFA) 108 (90 Base) MCG/ACT inhaler Inhale 2 puffs into the lungs every 4 (four) hours as needed for wheezing or shortness of breath. Patient not taking: Reported on 09/17/2020 10/28/17   Copland, Alicia B, PA-C  cetirizine (ZYRTEC) 10 MG tablet Take by mouth. Patient not taking: Reported on 09/17/2020 02/14/11   [provider]  clindamycin (CLEOCIN T) 1 % lotion clindamycin 1 % lotion  APPLY TO AFFECTED AREA ON GROIN ONCE TO TWICE DAILY AS NEEDED FOR FLARES Patient not taking: Reported on 09/17/2020  [provider]  fluticasone (FLONASE) 50 MCG/ACT nasal spray Place 2 sprays into both nostrils daily. Patient not taking: Reported on 09/17/2020 11/25/17   Copland, Alicia B, PA-C  hydrOXYzine (ATARAX/VISTARIL) 25 MG tablet Take 1-2 tablets by mouth nightly as needed for itch. Patient needs appointment for further refills. Patient not taking: Reported on 09/17/2020 09/10/20   Willeen Niece, MD  LORazepam (ATIVAN) 0.5 MG tablet Take 0.5 mg by mouth daily as needed. Patient not taking: Reported on 09/17/2020 08/08/20   [provider]    Physical Exam Vitals: Blood pressure 108/70, weight 162 lb (73.5 kg), last menstrual period 07/20/2020, currently breastfeeding. Body mass index is 28.7 kg/m.  General: NAD HEENT: normocephalic, anicteric Pulmonary: No increased work of breathing, CTAB Genitourinary:  External: Normal external female genitalia.  Normal urethral meatus, normal  Bartholin's and Skene's glands.    Vagina: Normal vaginal mucosa, no evidence of prolapse.    Cervix: Grossly normal in appearance, no bleeding  Uterus:  Non-enlarged, mobile, normal contour.  No CMT  Adnexa: ovaries non-enlarged, no adnexal masses  Rectal: deferred Extremities: no edema,  erythema, or tenderness Neurologic: Grossly intact Psychiatric: mood appropriate, affect full  Immunization History  Administered Date(s) Administered  . PFIZER SARS-COV-2 Vaccination 07/07/2020, 07/28/2020    Assessment: 34 y.o. G2P0101 at [redacted]w[redacted]d presenting to initiate prenatal care  Plan: 1) Avoid alcoholic beverages. 2) Patient encouraged not to smoke.  3) Discontinue the use of all non-medicinal drugs and chemicals.  4) Take prenatal vitamins daily.  5) Nutrition, food safety (fish, cheese advisories, and high nitrite foods) and exercise discussed. 6) Hospital and practice style discussed with cross coverage system.  7) Genetic Screening, such as with 1st Trimester Screening, cell free fetal DNA, AFP testing, and Ultrasound, as well as with amniocentesis and CVS as appropriate, is discussed with patient. At the conclusion of today's visit patient requested genetic testing   [name], MD, [name], [facility] 09/17/2020, 10:16 AM

## 2020-09-17 NOTE — Progress Notes (Signed)
NOB- flu shot

## 2020-09-18 LAB — RPR+RH+ABO+RUB AB+AB SCR+CB...
Antibody Screen: NEGATIVE
HIV Screen 4th Generation wRfx: NONREACTIVE
Hematocrit: 43.1 % (ref 34.0–46.6)
Hemoglobin: 14.6 g/dL (ref 11.1–15.9)
Hepatitis B Surface Ag: NEGATIVE
MCH: 30 pg (ref 26.6–33.0)
MCHC: 33.9 g/dL (ref 31.5–35.7)
MCV: 89 fL (ref 79–97)
Platelets: 314 10*3/uL (ref 150–450)
RBC: 4.87 x10E6/uL (ref 3.77–5.28)
RDW: 12 % (ref 11.7–15.4)
RPR Ser Ql: NONREACTIVE
Rh Factor: POSITIVE
Rubella Antibodies, IGG: 3.49 index (ref >0.99)
Varicella zoster IgG: 585 index (ref >165)
WBC: 9.9 10*3/uL (ref 3.4–10.8)

## 2020-09-18 MED ORDER — BONJESTA 20-20 MG PO TBCR: 1.0000 | EXTENDED_RELEASE_TABLET | Freq: Two times a day (BID) | ORAL | 3 refills | Status: DC

## 2020-09-19 LAB — CYTOLOGY - PAP
Chlamydia: NEGATIVE
Comment: NEGATIVE
Comment: NEGATIVE
Comment: NORMAL
Diagnosis: NEGATIVE
High risk HPV: NEGATIVE
Neisseria Gonorrhea: NEGATIVE

## 2020-09-19 LAB — URINE CULTURE

## 2020-09-21 ENCOUNTER — Other Ambulatory Visit: Payer: Self-pay | Admitting: Obstetrics and Gynecology

## 2020-09-21 MED ORDER — DOXYLAMINE-PYRIDOXINE 10-10 MG PO TBEC: 2.0000 | DELAYED_RELEASE_TABLET | Freq: Every day | ORAL | 5 refills | Status: DC

## 2020-09-26 ENCOUNTER — Ambulatory Visit (INDEPENDENT_AMBULATORY_CARE_PROVIDER_SITE_OTHER): Payer: BC Managed Care – PPO

## 2020-09-26 ENCOUNTER — Other Ambulatory Visit: Payer: Self-pay

## 2020-09-26 ENCOUNTER — Ambulatory Visit (INDEPENDENT_AMBULATORY_CARE_PROVIDER_SITE_OTHER): Payer: BC Managed Care – PPO | Admitting: Obstetrics and Gynecology

## 2020-09-26 ENCOUNTER — Other Ambulatory Visit: Payer: Self-pay | Admitting: Obstetrics and Gynecology

## 2020-09-26 VITALS — BP 123/74 | Wt 165.0 lb

## 2020-09-26 DIAGNOSIS — O0991 Supervision of high risk pregnancy, unspecified, first trimester: Secondary | ICD-10-CM | POA: Diagnosis not present

## 2020-09-26 DIAGNOSIS — O09891 Supervision of other high risk pregnancies, first trimester: Secondary | ICD-10-CM | POA: Diagnosis not present

## 2020-09-26 DIAGNOSIS — Z3689 Encounter for other specified antenatal screening: Secondary | ICD-10-CM

## 2020-09-26 DIAGNOSIS — O09899 Supervision of other high risk pregnancies, unspecified trimester: Secondary | ICD-10-CM

## 2020-09-26 DIAGNOSIS — Z3A09 9 weeks gestation of pregnancy: Secondary | ICD-10-CM

## 2020-09-26 DIAGNOSIS — O0990 Supervision of high risk pregnancy, unspecified, unspecified trimester: Secondary | ICD-10-CM

## 2020-09-26 DIAGNOSIS — Z3A10 10 weeks gestation of pregnancy: Secondary | ICD-10-CM

## 2020-09-26 LAB — POCT URINALYSIS DIPSTICK OB
Glucose, UA: NEGATIVE
POC,PROTEIN,UA: NEGATIVE

## 2020-09-26 NOTE — Progress Notes (Signed)
Routine Prenatal Care Visit  Subjective  Kaitlin [name] is a 34 y.o. G2P0101 at [redacted]w[redacted]d being seen today for ongoing prenatal care.  She is currently monitored for the following issues for this low-risk pregnancy and has Allergic rhinitis; Anxiety disorder; Chronic UTI; Depression; Eczema; Intermittent asthma, uncomplicated; PCOS (polycystic ovarian syndrome); Interstitial cystitis; Preterm delivery, delivered; Supervision of high risk pregnancy, antepartum; and History of preterm delivery, currently pregnant on their problem list.  ----------------------------------------------------------------------------------- Patient reports no complaints.   Contractions: Not present. Vag. Bleeding: None.  Movement: Absent. Denies leaking of fluid.  ----------------------------------------------------------------------------------- The following portions of the patient's history were reviewed and updated as appropriate: allergies, current medications, past family history, past medical history, past social history, past surgical history and problem list. Problem list updated.   Objective  Blood pressure 123/74, weight 165 lb (74.8 kg), last menstrual period 07/20/2020, currently breastfeeding. Pregravid weight 162 lb (73.5 kg) Total Weight Gain 3 lb (1.361 kg) Urinalysis:      Fetal Status: Fetal Heart Rate (bpm): 172   Movement: Absent     General:  Alert, oriented and cooperative. Patient is in no acute distress.  Skin: Skin is warm and dry. No rash noted.   Cardiovascular: Normal heart rate noted  Respiratory: Normal respiratory effort, no problems with respiration noted  Abdomen: Soft, gravid, appropriate for gestational age. Pain/Pressure: Absent     Pelvic:  Cervical exam deferred        Extremities: Normal range of motion.     ental Status: Normal mood and affect. Normal behavior. Normal judgment and thought content.   US OB Transvaginal  Result Date: 09/26/2020 Patient Name:  DONNAH LEVERT DOB: [birth year] MRN: [id number] ULTRASOUND REPORT Location: [facility] Date of Service: 09/26/2020 Indications:dating Findings: Mason Jim intrauterine pregnancy is visualized with a CRL consistent with [redacted]w[redacted]d gestation, giving an (U/S) EDD of 04/23/2021. The (U/S) EDD is consistent with the clinically established EDD of 04/26/2021. FHR: 172 BPM CRL measurement: 32.0 mm Yolk sac is visualized and appears normal. Amnion: visualized and appears normal Right Ovary is normal in appearance. Left Ovary is normal appearance. Corpus luteal cyst:  Left ovary Survey of the adnexa demonstrates no adnexal masses. There is no free peritoneal fluid in the cul de sac. Impression: 1. [redacted]w[redacted]d Viable Singleton Intrauterine pregnancy by U/S. 2. (U/S) EDD is consistent with Clinically established EDD of 04/26/2021, [redacted]w[redacted]d. Recommendations: 1.Clinical correlation with the patient's History and Physical Exam. Deanna Artis, RT There is a viable singleton gestation.  The fetal biometry correlates with established dating. Detailed evaluation of the fetal anatomy is precluded by early gestational age.  It must be noted that a normal ultrasound particular at this early gestational age is unable to rule out fetal aneuploidy, risk of first trimester miscarriage, or anatomic birth defects. [name], MD, Ever[facility], [facility] 09/26/2020, 11:44 AM     Assessment   34 y.o. G2P0101 at [redacted]w[redacted]d by  04/26/2021, by Last Menstrual Period presenting for routine prenatal visit  Plan   SECOND Problems (from 09/17/20 to present)    Problem Noted Resolved   Supervision of high risk pregnancy, antepartum 09/17/2020 by [name], MD No   Overview Addendum 09/27/2020 11:34 AM by [name], MD    Clinic [facility] Prenatal Labs  Dating LMP = 10 week [place] Blood type: A/Positive/-- (10/11 1209)   Genetic Screen 1 Screen:    AFP:     Quad:     NIPS: Antibody:Negative (10/11  1209)    Anatomic [place]  Rubella: 3.49 (10/11 1209) Varicella: Immune  GTT Early:               Third trimester:  RPR: Non Reactive (10/11 1209)   Rhogam  HBsAg: Negative (10/11 1209)   TDaP vaccine                       Flu Shot: HIV: Non Reactive (10/11 1209)   Baby Food                                GBS:   Contraception  Pap: 09/17/2020 NILM HPV negative  CBB     CS/VBAC    Support Person            Previous Version   History of preterm delivery, currently pregnant 09/17/2020 by [name], MD No   Preterm delivery, delivered 04/22/2017 by Conard Novak, MD No   Intermittent asthma, uncomplicated 02/26/2015 by Shanda Bumps No       Gestational age appropriate obstetric precautions including but not limited to vaginal bleeding, contractions, leaking of fluid and fetal movement were reviewed in detail with the patient.    2 week MaterniT21 Discussed Makena 16 week cervical length  Return in about 2 weeks (around 10/10/2020) for ROB.  [name], MD, Evern Core [facility], [facility] 09/26/2020, 11:45 AM

## 2020-09-28 ENCOUNTER — Other Ambulatory Visit: Payer: Self-pay | Admitting: Obstetrics and Gynecology

## 2020-09-28 MED ORDER — DOXYLAMINE-PYRIDOXINE 10-10 MG PO TBEC: 2.0000 | DELAYED_RELEASE_TABLET | Freq: Every day | ORAL | 5 refills | Status: DC

## 2020-10-02 LAB — INHERITEST CORE(CF97,SMA,FRAX)

## 2020-10-13 ENCOUNTER — Other Ambulatory Visit: Payer: Self-pay | Admitting: Dermatology

## 2020-10-19 ENCOUNTER — Ambulatory Visit (INDEPENDENT_AMBULATORY_CARE_PROVIDER_SITE_OTHER): Payer: BC Managed Care – PPO | Admitting: Obstetrics and Gynecology

## 2020-10-19 ENCOUNTER — Other Ambulatory Visit: Payer: Self-pay

## 2020-10-19 ENCOUNTER — Encounter: Payer: Self-pay | Admitting: Obstetrics and Gynecology

## 2020-10-19 VITALS — BP 130/70 | Wt 166.0 lb

## 2020-10-19 DIAGNOSIS — O0990 Supervision of high risk pregnancy, unspecified, unspecified trimester: Secondary | ICD-10-CM

## 2020-10-19 DIAGNOSIS — J4520 Mild intermittent asthma, uncomplicated: Secondary | ICD-10-CM

## 2020-10-19 DIAGNOSIS — Z3A13 13 weeks gestation of pregnancy: Secondary | ICD-10-CM

## 2020-10-19 DIAGNOSIS — M545 Low back pain, unspecified: Secondary | ICD-10-CM

## 2020-10-19 DIAGNOSIS — O09899 Supervision of other high risk pregnancies, unspecified trimester: Secondary | ICD-10-CM

## 2020-10-19 DIAGNOSIS — Z1379 Encounter for other screening for genetic and chromosomal anomalies: Secondary | ICD-10-CM

## 2020-10-19 LAB — POCT URINALYSIS DIPSTICK OB
Glucose, UA: NEGATIVE
POC,PROTEIN,UA: NEGATIVE

## 2020-10-19 NOTE — Progress Notes (Signed)
Routine Prenatal Care Visit  Subjective  Kaitlin [name] is a 34 y.o. G2P0101 at [redacted]w[redacted]d being seen today for ongoing prenatal care.  She is currently monitored for the following issues for this high-risk pregnancy and has Allergic rhinitis; Anxiety disorder; Chronic UTI; Depression; Eczema; Intermittent asthma, uncomplicated; PCOS (polycystic ovarian syndrome); Interstitial cystitis; Supervision of high risk pregnancy, antepartum; and History of preterm delivery, currently pregnant on their problem list.  ----------------------------------------------------------------------------------- Patient reports backache.   Contractions: Not present. Vag. Bleeding: None.  Movement: Absent. Denies leaking of fluid.  ----------------------------------------------------------------------------------- The following portions of the patient's history were reviewed and updated as appropriate: allergies, current medications, past family history, past medical history, past social history, past surgical history and problem list. Problem list updated.   Objective  Blood pressure 130/70, weight 166 lb (75.3 kg), last menstrual period 07/20/2020, unknown if currently breastfeeding. Pregravid weight 162 lb (73.5 kg) Total Weight Gain 4 lb (1.814 kg) Urinalysis:      Fetal Status: Fetal Heart Rate (bpm): 150   Movement: Absent     General:  Alert, oriented and cooperative. Patient is in no acute distress.  Skin: Skin is warm and dry. No rash noted.   Cardiovascular: Normal heart rate noted  Respiratory: Normal respiratory effort, no problems with respiration noted  Abdomen: Soft, gravid, appropriate for gestational age. Pain/Pressure: Absent     Pelvic:  Cervical exam deferred        Extremities: Normal range of motion.     ental Status: Normal mood and affect. Normal behavior. Normal judgment and thought content.     Assessment   34 y.o. G2P0101 at [redacted]w[redacted]d by  04/26/2021, by Last Menstrual Period  presenting for routine prenatal visit  Plan   SECOND Problems (from 09/17/20 to 10/19/20)    Problem Noted Resolved   Supervision of high risk pregnancy, antepartum 09/17/2020 by [name], MD No   Overview Addendum 09/27/2020 11:34 AM by [name], MD    Clinic [facility] Prenatal Labs  Dating LMP = 10 week [place] Blood type: A/Positive/-- (10/11 1209)   Genetic Screen 1 Screen:    AFP:     Quad:     NIPS: Antibody:Negative (10/11 1209)  Anatomic [place]  Rubella: 3.49 (10/11 1209) Varicella: Immune  GTT Early:               Third trimester:  RPR: Non Reactive (10/11 1209)   Rhogam  HBsAg: Negative (10/11 1209)   TDaP vaccine                       Flu Shot: HIV: Non Reactive (10/11 1209)   Baby Food                                GBS:   Contraception  Pap: 09/17/2020 NILM HPV negative  CBB     CS/VBAC    Support Person            Previous Version   History of preterm delivery, currently pregnant 09/17/2020 by [name], MD No   Intermittent asthma, uncomplicated 02/26/2015 by Shanda Bumps No   Preterm delivery, delivered 04/22/2017 by Conard Novak, MD 10/19/2020 by [name], MD       Gestational age appropriate obstetric precautions including but not limited to vaginal bleeding, contractions, leaking of fluid and fetal movement were reviewed in detail with the patient.  1) History PTB - cervical length at 16 weeks - undecided on Makena may opt for vaginal progesterone suppositories.  Would be interested in self injection because of schedule if auto-injector   2) Lumbago - history of back issues outside of pregnancy used TEN unit - PT referral   3) Genetics - Maternity 21 today  Return in about 3 weeks (around 11/09/2020) for ROB and cervical length [place].  [name], MD, [name], [facility] 10/19/2020, 9:36 AM

## 2020-10-19 NOTE — Progress Notes (Signed)
ROB- no concerns 

## 2020-10-22 ENCOUNTER — Telehealth: Payer: Self-pay

## 2020-10-22 NOTE — Telephone Encounter (Signed)
LMVM (confidential) that pretest counseling was done by provider prior to test being drawn.

## 2020-10-22 NOTE — Telephone Encounter (Signed)
Mary w/[facility] Prior auth department calling to confirm if patient had pretest counseling regarding inheritest for [facility] 09/17/20. Cb#[phone] ext [phone]

## 2020-10-25 LAB — MATERNIT 21 PLUS CORE, BLOOD
Fetal Fraction: 9
Result (T21): NEGATIVE
Trisomy 13 (Patau syndrome): NEGATIVE
Trisomy 18 (Edwards syndrome): NEGATIVE
Trisomy 21 (Down syndrome): NEGATIVE

## 2020-10-29 ENCOUNTER — Ambulatory Visit: Payer: BC Managed Care – PPO

## 2020-11-07 ENCOUNTER — Ambulatory Visit (INDEPENDENT_AMBULATORY_CARE_PROVIDER_SITE_OTHER): Payer: BC Managed Care – PPO

## 2020-11-07 ENCOUNTER — Other Ambulatory Visit: Payer: Self-pay

## 2020-11-07 ENCOUNTER — Ambulatory Visit (INDEPENDENT_AMBULATORY_CARE_PROVIDER_SITE_OTHER): Payer: BC Managed Care – PPO | Admitting: Obstetrics and Gynecology

## 2020-11-07 ENCOUNTER — Encounter: Payer: Self-pay | Admitting: Obstetrics and Gynecology

## 2020-11-07 VITALS — BP 120/72 | Wt 170.0 lb

## 2020-11-07 DIAGNOSIS — O09899 Supervision of other high risk pregnancies, unspecified trimester: Secondary | ICD-10-CM | POA: Diagnosis not present

## 2020-11-07 DIAGNOSIS — Z3A15 15 weeks gestation of pregnancy: Secondary | ICD-10-CM

## 2020-11-07 DIAGNOSIS — O0990 Supervision of high risk pregnancy, unspecified, unspecified trimester: Secondary | ICD-10-CM

## 2020-11-07 DIAGNOSIS — Z363 Encounter for antenatal screening for malformations: Secondary | ICD-10-CM

## 2020-11-07 DIAGNOSIS — J4520 Mild intermittent asthma, uncomplicated: Secondary | ICD-10-CM

## 2020-11-07 LAB — POCT URINALYSIS DIPSTICK OB
Glucose, UA: NEGATIVE
POC,PROTEIN,UA: NEGATIVE

## 2020-11-07 MED ORDER — MAKENA 275 MG/1.1ML ~~LOC~~ SOAJ: 275.0000 mg | SUBCUTANEOUS | 1 refills | Status: DC

## 2020-11-07 NOTE — Progress Notes (Signed)
Routine Prenatal Care Visit  Subjective  Kaitlin [name] is a 34 y.o. G2P0101 at [redacted]w[redacted]d being seen today for ongoing prenatal care.  She is currently monitored for the following issues for this high-risk pregnancy and has Allergic rhinitis; Anxiety disorder; Chronic UTI; Depression; Eczema; Intermittent asthma, uncomplicated; PCOS (polycystic ovarian syndrome); Interstitial cystitis; Supervision of high risk pregnancy, antepartum; and History of preterm delivery, currently pregnant on their problem list.  ----------------------------------------------------------------------------------- Patient reports no complaints.   Contractions: Not present. Vag. Bleeding: None.  Movement: Present. Denies leaking of fluid.  ----------------------------------------------------------------------------------- The following portions of the patient's history were reviewed and updated as appropriate: allergies, current medications, past family history, past medical history, past social history, past surgical history and problem list. Problem list updated.   Objective  Blood pressure 120/72, weight 170 lb (77.1 kg), last menstrual period 07/20/2020, unknown if currently breastfeeding. Pregravid weight 162 lb (73.5 kg) Total Weight Gain 8 lb (3.629 kg) Urinalysis:      Fetal Status: Fetal Heart Rate (bpm): 150   Movement: Present     General:  Alert, oriented and cooperative. Patient is in no acute distress.  Skin: Skin is warm and dry. No rash noted.   Cardiovascular: Normal heart rate noted  Respiratory: Normal respiratory effort, no problems with respiration noted  Abdomen: Soft, gravid, appropriate for gestational age. Pain/Pressure: Absent     Pelvic:  Cervical exam deferred        Extremities: Normal range of motion.     ental Status: Normal mood and affect. Normal behavior. Normal judgment and thought content.   US OB Transvaginal  Result Date: 11/07/2020 Patient Name: HONESTEE REVARD DOB:  11/03/1986 MRN: [id number] ULTRASOUND REPORT Location: [facility] Date of Service: 11/07/2020 Indications:Previous preterm delivery Findings: Mason Jim intrauterine pregnancy is visualized. FHR: 153 BPM Stomach:  visualized Kidneys: visualized Bladder: visualized Transvaginal cervical length performed. Cervical length measures  3.56 cm in the shortest dimension. There is no change with fundal pressure. No funneling is present. Impression: 1. Unknown viable Singleton Intrauterine pregnancy by previously established criteria. 2. Cervical length is 3.56 cm. Recommendations: 1.Clinical correlation with the patient's History and Physical Exam. Deanna Artis, RT Normal cervical length ultrasound today.  [name], MD, Evern [facility], [facility] 11/07/2020, 11:46 AM    Assessment   34 y.o. G2P0101 at [redacted]w[redacted]d by  04/26/2021, by Last Menstrual Period presenting for routine prenatal visit  Plan   SECOND Problems (from 09/17/20 to present)    Problem Noted Resolved   Supervision of high risk pregnancy, antepartum 09/17/2020 by [name], MD No   Overview Addendum 10/26/2020  9:03 AM by [name], MD    Clinic [facility] Prenatal Labs  Dating LMP = 10 week [place] Blood type: A/Positive/-- (10/11 1209)   Genetic Screen Inheritest negative SMA, CF, and Fragile-X     NIPS: Normal XX Antibody:Negative (10/11 1209)  Anatomic [place]  Rubella: 3.49 (10/11 1209) Varicella: Immune  GTT Early:               Third trimester:  RPR: Non Reactive (10/11 1209)   Rhogam  HBsAg: Negative (10/11 1209)   TDaP vaccine                       Flu Shot: HIV: Non Reactive (10/11 1209)   Baby Food  GBS:   Contraception  Pap: 09/17/2020 NILM HPV negative  CBB     CS/VBAC    Support Person            Previous Version   History of preterm delivery, currently pregnant 09/17/2020 by [name], MD No   Intermittent asthma, uncomplicated 02/26/2015 by  Shanda Bumps No   Preterm delivery, delivered 04/22/2017 by Conard Novak, MD 10/19/2020 by [name], MD       Gestational age appropriate obstetric precautions including but not limited to vaginal bleeding, contractions, leaking of fluid and fetal movement were reviewed in detail with the patient.    - normal cervical length - discussed pros and cons of Makena again.  Interested if insurance coverage  Return in about 4 weeks (around 12/05/2020) for ROB and anatomy scan.  [name], MD, Evern Core [facility], [facility] 11/07/2020, 12:15 PM

## 2020-11-07 NOTE — Progress Notes (Signed)
ROB - No concerns RM 5 

## 2020-11-08 ENCOUNTER — Ambulatory Visit: Payer: BC Managed Care – PPO | Attending: Obstetrics and Gynecology

## 2020-11-08 DIAGNOSIS — M533 Sacrococcygeal disorders, not elsewhere classified: Secondary | ICD-10-CM | POA: Insufficient documentation

## 2020-11-08 DIAGNOSIS — R293 Abnormal posture: Secondary | ICD-10-CM | POA: Diagnosis not present

## 2020-11-08 DIAGNOSIS — M62838 Other muscle spasm: Secondary | ICD-10-CM | POA: Diagnosis present
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
## 2020-11-08 NOTE — Patient Instructions (Signed)
Stabilization: Diaphragmatic Breathing    Lie with knees bent, feet flat. Place one hand on stomach, other on chest. Breathe deeply through nose, lifting belly hand without any motion of hand on chest. Practice for 3-5 min. Per night and throughout the day if you feel your stress or anxiety increasing.

## 2020-11-16 ENCOUNTER — Ambulatory Visit: Payer: BC Managed Care – PPO

## 2020-11-16 ENCOUNTER — Other Ambulatory Visit: Payer: Self-pay

## 2020-11-16 DIAGNOSIS — M62838 Other muscle spasm: Secondary | ICD-10-CM

## 2020-11-16 DIAGNOSIS — M533 Sacrococcygeal disorders, not elsewhere classified: Secondary | ICD-10-CM

## 2020-11-16 DIAGNOSIS — R293 Abnormal posture: Secondary | ICD-10-CM

## 2020-11-16 NOTE — Therapy (Signed)
[facility] MAIN [facility] SERVICES [address] [place], [place], [address] Phone: [phone]   Fax:  [phone]  Physical Therapy Treatment  The patient has been informed of current processes in place at Outpatient Rehab to protect patients from Covid-19 exposure including social distancing, schedule modifications, and new cleaning procedures. After discussing their particular risk with a therapist based on the patient's personal risk factors, the patient has decided to proceed with in-person therapy.  Patient Details  Name: Kaitlin [name] MRN: [id number] Date of Birth: Nov 02, 1986 No data recorded  Encounter Date: 11/16/2020   PT End of Session - 11/16/20 0958    Visit Number 2    Number of Visits 10    Date for PT Re-Evaluation 01/17/21    Authorization Type BCBS    Authorization Time Period 11/08/20 through 01/17/21    Authorization - Visit Number 2    Authorization - Number of Visits 10    PT Start Time 0831    PT Stop Time 0943    PT Time Calculation (min) 72 min    Activity Tolerance Patient tolerated treatment well;No increased pain    Behavior During Therapy WFL for tasks assessed/performed           Past Medical History:  Diagnosis Date  . Anxiety and depression   . Asthma   . Eczema   . Interstitial cystitis   . PCOS (polycystic ovarian syndrome)   . Premature labor before 37 weeks of gestation with delivery, delivered    PPROM at 34 weeks    Past Surgical History:  Procedure Laterality Date  . Excision of lymph node  02/24/2013   sublingual lymph node: Dr Gertie Baron  . INTRAUTERINE DEVICE INSERTION  06/01/2017   [facility]  . WISDOM TOOTH EXTRACTION      There were no vitals filed for this visit.   Pelvic Floor Physical Therapy Treatment Note  SCREENING  Changes in medications, allergies, or medical history?: none    SUBJECTIVE  Patient reports: Some days are better than others. Had a bad headache  last night. Was born with hip dysplasia "hips out"   Precautions:  [redacted] weeks pregnant, anxiety and depression, asthma, PCOS, IC. h/o lyme's disease and cat-scratch fever.  Pain update:  Sexual activity/pain: Has pain with intercourse that has been constant for at least 12 years.   Location of pain: LB and L>R hip Current pain: 1/10  Max pain: 5/10 Least pain: 1/10 Nature of pain:achy/dull and heaviness with loading.  **pt. Reports feeling good following treatment with belly-band on.  Patient Goals: Not be in pain and have tools if her pain was to increase.   OBJECTIVE  Changes in: Posture/Observations:  L up-slip   **PSIS appear level in seated following treatment.  Range of Motion/Flexibilty:  Following treatment pt. Demonstrates less L overhead reach ROM compared to R due to release of L QL only.  Strength/MMT:  LE MMT:  Pelvic floor:  Abdominal:  Pt. Using belly-breathing appropriately in side-lying and supine with self TC only. Report of difficulty with ful exhale promped diaphragm release.  Palpation: TTP to L Ql, Iliacus, Psoas, and diaphragm, R multifidus near L5.  Gait Analysis:  INTERVENTIONS THIS SESSION: Manual: TP release to L Ql, Iliacus, Psoas, and diaphragm, R multifidus near L5 followed by L up-slip correction to decrease spasm and pain and allow for improved balance of musculature for improved function and decreased symptoms.  Therex: Educated on and practiced posterior pelvic tilt  and side-stretch in seated to improve strength of muscles opposing tight musculature to allow reciprocal inhibition to improve balance of musculature surrounding the pelvis and improve overall posture for optimal musculature length-tension relationship and function and To maintain and improve muscle length and allow for improved balance of musculature for long-term symptom relief.  Self-care: educated on how to improve ergonomic set-up of home work environment  including elevating foot rest and supporting LB as well as varying standing positions but not letting hip "pop". Educated berifly on how to perform humming with breathing to improve vagal toning.  Total time: 72 min.                               PT Short Term Goals - 11/08/20 1509      PT SHORT TERM GOAL #1   Title Patient will demonstrate improved pelvic alignment and balance of musculature surrounding the pelvis to facilitate decreased PFM spasms and decrease pelvic pain.    Baseline Pt. deomnstrates L up-slip and spasms surrounding L>R hip.    Time 5    Period Weeks    Status New    Target Date 12/13/20      PT SHORT TERM GOAL #2   Title Patient will demonstrate HEP x1 in the clinic to demonstrate understanding and proper form to allow for further improvement.    Baseline Pt. lacks knowledge of which therapeutic exercise can help decrease her Sx.    Time 5    Period Weeks    Status New    Target Date 12/13/20      PT SHORT TERM GOAL #3   Title Patient will demonstrate improved sitting and standing posture to demonstrate learning and decrease stress on the pelvic floor with functional activity.    Baseline hyperkyphotic/lordotic posture with decreased thoracic mobility and diastasis    Time 5    Period Weeks    Status New    Target Date 12/13/20             PT Long Term Goals - 11/08/20 1514      PT LONG TERM GOAL #1   Title Patient will report no episodes of SUI over the course of the prior two weeks to demonstrate improved functional ability.    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    Time 10    Period Weeks    Status New    Target Date 01/17/21      PT LONG TERM GOAL #2   Title Patient will report no pain with intercourse to demonstrate improved functional ability and demonstrate decreased PFM tension and decreased risk of tearing or delivery complications..    Baseline Pt. has had pain with intercourse for at least 12 years.     Time 10    Period Weeks    Status New    Target Date 01/17/21      PT LONG TERM GOAL #3   Title Patient will report having BM's at least every-other day with consistency between [facility] stool scale 3-5 over the prior week to demonstrate decreased constipation.    Baseline Pt. able to go every 3-4 days but needs to use magnesium to get things moving often. h/o 3-4 weeks between BM's    Time 10    Period Weeks    Status New    Target Date 01/17/21      PT LONG TERM GOAL #4   Title  Pt. will improve in FOTO score by 10 points or maximal score in each category to demonstrate improved function.    Baseline PFDIPain: 54, PFDI Urinary: 17, Bowel Cnst: 68, Urinary Problem: 59, PFDI Bowel: 4    Time 10    Period Weeks    Status New    Target Date 01/17/21                 Plan - 11/16/20 1010    Clinical Impression Statement Pt. Responded well to all interventions today, demonstrating improved pelvic alignment, decreased spasm, as well as understanding and correct performance of all education and exercises provided today. They will continue to benefit from skilled physical therapy to work toward remaining goals and maximize function as well as decrease likelihood of symptom increase or recurrence.    PT Next Visit Plan re-assess pelvic alignment in standing and supine, measure for LLD when pelvis aligned, perform mobs to upper back and teach scap retraction and chin-tucks, show posturific and taping for posture.    PT Home Exercise Plan diaphragmatic breathing, side-stretch, seated pelvic tilts.    Consulted and Agree with Plan of Care Patient           Patient will benefit from skilled therapeutic intervention in order to improve the following deficits and impairments:     Visit Diagnosis: Abnormal posture  Sacrococcygeal disorders, not elsewhere classified  Other muscle spasm     Problem List Patient Active Problem List   Diagnosis Date Noted  . Supervision of high risk  pregnancy, antepartum 09/17/2020  . History of preterm delivery, currently pregnant 09/17/2020  . Interstitial cystitis 03/12/2017  . Allergic rhinitis 02/26/2015  . Anxiety disorder 02/26/2015  . Chronic UTI 02/26/2015  . Eczema 02/26/2015  . Intermittent asthma, uncomplicated 02/26/2015  . PCOS (polycystic ovarian syndrome) 02/26/2015  . Depression 02/18/2012   Cleophus Molt DPT, ATC Cleophus Molt 11/16/2020, 10:11 AM  [facility] MAIN [facility] SERVICES [address] [place], [place], [address] Phone: [phone]   Fax:  [phone]  Name: SHAQUANA BUEL MRN: [id number] Date of Birth: 1986-04-23

## 2020-11-16 NOTE — Patient Instructions (Signed)
   Do 2 sets of 15 tilts per day. Breathe in when you tilt forward (A) and out when you tuck under (B).    Hold for 30 seconds (5 deep breaths) and repeat 2-3 times on each side once a day

## 2020-11-23 ENCOUNTER — Other Ambulatory Visit: Payer: Self-pay | Admitting: Obstetrics and Gynecology

## 2020-11-23 MED ORDER — CYCLOBENZAPRINE HCL 10 MG PO TABS: 10.0000 mg | ORAL_TABLET | Freq: Three times a day (TID) | ORAL | 0 refills | Status: DC | PRN

## 2020-11-26 ENCOUNTER — Ambulatory Visit: Payer: BC Managed Care – PPO

## 2020-11-26 ENCOUNTER — Other Ambulatory Visit: Payer: Self-pay

## 2020-11-26 DIAGNOSIS — R293 Abnormal posture: Secondary | ICD-10-CM

## 2020-11-26 DIAGNOSIS — M62838 Other muscle spasm: Secondary | ICD-10-CM

## 2020-11-26 DIAGNOSIS — M533 Sacrococcygeal disorders, not elsewhere classified: Secondary | ICD-10-CM

## 2020-11-26 NOTE — Patient Instructions (Signed)
 "  The Jiggle": Place an open hand lightly on the thigh/glute etc. And at a moderate rhythm, do small "jiggles" to the skin layer of tissue in the direction of least resistance. Do this for ~ 10-20 min. As needed to help calm the nervous system, loosen tension in the fascia, and decrease pain.  Pelvic Tilt With Pelvic Floor (Hook-Lying)        Lie with hips and knees bent. Exhale and flatten low back while breathing out so that pelvis tilts. Repeat _2x15__ times. Do _1-2__ times a day.

## 2020-11-26 NOTE — Therapy (Signed)
[place] [facility] MAIN [facility] SERVICES [address] [place], [place], [address] Phone: [phone]   Fax:  [phone]  Physical Therapy Treatment  The patient has been informed of current processes in place at Outpatient Rehab to protect patients from Covid-19 exposure including social distancing, schedule modifications, and new cleaning procedures. After discussing their particular risk with a therapist based on the patient's personal risk factors, the patient has decided to proceed with in-person therapy.  Patient Details  Name: Kaitlin [name] MRN: [id number] Date of Birth: [birth year] No data recorded  Encounter Date: 11/26/2020   PT End of Session - 11/26/20 1057    Visit Number 3    Number of Visits 10    Date for PT Re-Evaluation 01/17/21    Authorization Type BCBS    Authorization Time Period 11/08/20 through 01/17/21    Authorization - Visit Number 3    Authorization - Number of Visits 10    PT Start Time 0830    PT Stop Time 0930    PT Time Calculation (min) 60 min    Activity Tolerance Patient tolerated treatment well;No increased pain    Behavior During Therapy WFL for tasks assessed/performed           Past Medical History:  Diagnosis Date  . Anxiety and depression   . Asthma   . Eczema   . Interstitial cystitis   . PCOS (polycystic ovarian syndrome)   . Premature labor before 37 weeks of gestation with delivery, delivered    PPROM at 34 weeks    Past Surgical History:  Procedure Laterality Date  . Excision of lymph node  02/24/2013   sublingual lymph node: Dr Gertie Baron  . INTRAUTERINE DEVICE INSERTION  06/01/2017   [facility]  . WISDOM TOOTH EXTRACTION      There were no vitals filed for this visit.   Pelvic Floor Physical Therapy Treatment Note  SCREENING  Changes in medications, allergies, or medical history?: none    SUBJECTIVE  Patient reports: Has been in a lot of pain, Dr. Jeanene Erb in [facility] for  her. Has felt tender at the mons pubis and at her sitz bones B especially with sitting. She did not sleep very well last night because she gets anxious when she knows she has an appointment in the morning.  Precautions:  [redacted] weeks pregnant, anxiety and depression, asthma, PCOS, IC. h/o lyme's disease and cat-scratch fever.  Pain update:  Sexual activity/pain: Has pain with intercourse that has been constant for at least 12 years.   Location of pain: LB and L>R hip (sitz bones) Current pain: 1/10 (2/10) Max pain: 7/10 (7/10) Least pain: 1/10 (1/10) Nature of pain:achy/dull and heaviness with loading.  ** .5/10 pain in LB following treatment and able to sit without pain at sitz bones  Patient Goals: Not be in pain and have tools if her pain was to increase.   OBJECTIVE  Changes in: Posture/Observations:  PSIS appear level in standing.  Range of Motion/Flexibilty:  Following treatment pt. Demonstrates less L overhead reach ROM compared to R due to release of L QL only. (from previous session)  Strength/MMT:  LE MMT:  Pelvic floor:  Abdominal:  Rectus-abdominus spasm   Palpation: TTP to B coccygeus, OI, and rectus abdominus.  Gait Analysis:  INTERVENTIONS THIS SESSION: Manual: TP release to B coccygeus, OI, and rectus abdominus with simultaneous jiggling to decrease spasm and pain and allow for improved balance of musculature for improved function and  decreased symptoms.  Therex: Modified posterior pelvic tilts to supine and emphasized not exaggerating extension and only flattening the LB, not lifting tailbone off of the bed. Educated on how she or her partner can perform either TP release or jiggles at home to help manage pain and spasms as well.  Self-care: Given [place] sample pack and encouraged to discuss with her OBGYN before beginning as well as explained how PFM spasms can also lead to "false" UTI Sx and explained how we started doing external PFM release today  which is beginning to work toward this goal.  Total time: 60 min.                            PT Short Term Goals - 11/08/20 1509      PT SHORT TERM GOAL #1   Title Patient will demonstrate improved pelvic alignment and balance of musculature surrounding the pelvis to facilitate decreased PFM spasms and decrease pelvic pain.    Baseline Pt. deomnstrates L up-slip and spasms surrounding L>R hip.    Time 5    Period Weeks    Status New    Target Date 12/13/20      PT SHORT TERM GOAL #2   Title Patient will demonstrate HEP x1 in the clinic to demonstrate understanding and proper form to allow for further improvement.    Baseline Pt. lacks knowledge of which therapeutic exercise can help decrease her Sx.    Time 5    Period Weeks    Status New    Target Date 12/13/20      PT SHORT TERM GOAL #3   Title Patient will demonstrate improved sitting and standing posture to demonstrate learning and decrease stress on the pelvic floor with functional activity.    Baseline hyperkyphotic/lordotic posture with decreased thoracic mobility and diastasis    Time 5    Period Weeks    Status New    Target Date 12/13/20             PT Long Term Goals - 11/08/20 1514      PT LONG TERM GOAL #1   Title Patient will report no episodes of SUI over the course of the prior two weeks to demonstrate improved functional ability.    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    Time 10    Period Weeks    Status New    Target Date 01/17/21      PT LONG TERM GOAL #2   Title Patient will report no pain with intercourse to demonstrate improved functional ability and demonstrate decreased PFM tension and decreased risk of tearing or delivery complications..    Baseline Pt. has had pain with intercourse for at least 12 years.    Time 10    Period Weeks    Status New    Target Date 01/17/21      PT LONG TERM GOAL #3   Title Patient will report having BM's at least every-other day  with consistency between [facility] stool scale 3-5 over the prior week to demonstrate decreased constipation.    Baseline Pt. able to go every 3-4 days but needs to use magnesium to get things moving often. h/o 3-4 weeks between BM's    Time 10    Period Weeks    Status New    Target Date 01/17/21      PT LONG TERM GOAL #4   Title Pt. will  improve in FOTO score by 10 points or maximal score in each category to demonstrate improved function.    Baseline PFDIPain: 54, PFDI Urinary: 17, Bowel Cnst: 68, Urinary Problem: 59, PFDI Bowel: 4    Time 10    Period Weeks    Status New    Target Date 01/17/21                 Plan - 11/26/20 1057    Clinical Impression Statement Pt. Responded well to all interventions today, demonstrating decreased spasms and pain, as well as understanding and correct performance of all education and exercises provided today. They will continue to benefit from skilled physical therapy to work toward remaining goals and maximize function as well as decrease likelihood of symptom increase or recurrence.    PT Next Visit Plan re-assess pelvic alignment in standing and supine, measure for LLD, perform mobs to upper back and teach scap retraction and chin-tucks, show posturific and taping for posture. discuss PFM assessment/perform externally/over clothes if OK    PT Home Exercise Plan diaphragmatic breathing, side-stretch, seated pelvic tilts, jiggles, supine pelvic tilts.    Consulted and Agree with Plan of Care Patient           Patient will benefit from skilled therapeutic intervention in order to improve the following deficits and impairments:     Visit Diagnosis: Abnormal posture  Sacrococcygeal disorders, not elsewhere classified  Other muscle spasm     Problem List Patient Active Problem List   Diagnosis Date Noted  . Supervision of high risk pregnancy, antepartum 09/17/2020  . History of preterm delivery, currently pregnant 09/17/2020  .  Interstitial cystitis 03/12/2017  . Allergic rhinitis 02/26/2015  . Anxiety disorder 02/26/2015  . Chronic UTI 02/26/2015  . Eczema 02/26/2015  . Intermittent asthma, uncomplicated 02/26/2015  . PCOS (polycystic ovarian syndrome) 02/26/2015  . Depression 02/18/2012   Cleophus Molt DPT, ATC Cleophus Molt 11/26/2020, 11:01 AM  [facility] MAIN [facility] SERVICES [address] [place], [place], [address] Phone: [phone]   Fax:  [phone]  Name: Kaitlin [name] MRN: [id number] Date of Birth: Aug 13, 1986

## 2020-12-03 ENCOUNTER — Other Ambulatory Visit: Payer: Self-pay

## 2020-12-03 ENCOUNTER — Ambulatory Visit: Payer: BC Managed Care – PPO

## 2020-12-03 DIAGNOSIS — R293 Abnormal posture: Secondary | ICD-10-CM

## 2020-12-03 DIAGNOSIS — M533 Sacrococcygeal disorders, not elsewhere classified: Secondary | ICD-10-CM

## 2020-12-03 DIAGNOSIS — M62838 Other muscle spasm: Secondary | ICD-10-CM

## 2020-12-03 NOTE — Patient Instructions (Signed)
    This is The QL muscle  To perform release on this muscle, start by getting into this position by bridging the hips up and then slowly lowering your back, then your butt down to lengthen the low back then put the ball under you where you feel the tender spot and roll to the same side slightly to add pressure as needed. Hold still and take deep breaths until the pain is at least 50% less or, ideally, just pressure.   This is your piriformis    To release this muscle start in this position with your ankle crossed over the opposite knee. Place the tennis ball under your buttock where the tender spot is and then slightly roll your weight to the same side to put just enough pressure that it is uncomfortable. Hold and take deep breaths until the pain is at least 50% less or, ideally ,just pressure.   This is your Gluteus Medius and Minimus  To perform release on this muscle, start by getting into this position by bridging the hips up and then slowly lowering your back, then your butt down to lengthen the low back then put the ball under you where you feel the tender spot and roll to the same side slightly to add pressure as needed. Hold still and take deep breaths until the pain is at least 50% less or, ideally, just pressure.   These are your deep hip-flexor muscles. They are easiest to reach where they come together at the hip.   To perform release on this muscle, start by getting into this position by laying on your stomach then put the ball under you where you feel the tender spot and bring the opposite knee up/out to the side to add pressure as needed. Hold still and take deep breaths until the pain is at least 50% less or, ideally ,just pressure.   Hip Abduction (Standing)    Stand with support. Squeeze deep core and hold. Start with leg to the side/back on your toe and gently squeeze the outer hip to lift the toe off of the ground. Hold for 1 second then repeat. You should feel this in the  outer hip, not the front, not your side,  And not your back. Repeat __5_ times. Do _4__ sets  a day.  HIP Extension - Standing    Stand with support. Squeeze deep core and hold. Start with leg to the back on your toe and gently squeeze the buttock to lift the toe off of the ground. Hold for 1 second then repeat. You should feel this in the outer hip, not the front, not your side,  And not your back. Repeat __5_ times. Do _4__ sets  a day.

## 2020-12-03 NOTE — Therapy (Signed)
[place] [facility] MAIN [facility] SERVICES [address] [place], [place], [address] Phone: [phone]   Fax:  [phone]  Physical Therapy Treatment  The patient has been informed of current processes in place at Outpatient Rehab to protect patients from Covid-19 exposure including social distancing, schedule modifications, and new cleaning procedures. After discussing their particular risk with a therapist based on the patient's personal risk factors, the patient has decided to proceed with in-person therapy.  Patient Details  Name: Kaitlin [name] MRN: [id number] Date of Birth: [birth year] No data recorded  Encounter Date: 12/03/2020   PT End of Session - 12/03/20 1202    Visit Number 4    Number of Visits 10    Date for PT Re-Evaluation 01/17/21    Authorization Type BCBS    Authorization Time Period 11/08/20 through 01/17/21    Authorization - Visit Number 4    Authorization - Number of Visits 10    PT Start Time 0830    PT Stop Time 0930    PT Time Calculation (min) 60 min    Activity Tolerance Patient tolerated treatment well;No increased pain    Behavior During Therapy WFL for tasks assessed/performed           Past Medical History:  Diagnosis Date   Anxiety and depression    Asthma    Eczema    Interstitial cystitis    PCOS (polycystic ovarian syndrome)    Premature labor before 37 weeks of gestation with delivery, delivered    PPROM at 34 weeks    Past Surgical History:  Procedure Laterality Date   Excision of lymph node  02/24/2013   sublingual lymph node: Dr Gertie Baron   INTRAUTERINE DEVICE INSERTION  06/01/2017   [facility]   WISDOM TOOTH EXTRACTION      There were no vitals filed for this visit.   Pelvic Floor Physical Therapy Treatment Note  SCREENING  Changes in medications, allergies, or medical history?: none    SUBJECTIVE  Patient reports: Felt good after last session but as the day goes on the  pain increased again. Back feels a lot better but she has been feeling the pain at the sitz bones and in the pelvis. Had pain in the groin following doing a lot of walking yesterday in the mall. Sitting in her desk chair seems to bother her the most.  Precautions:  [redacted] weeks pregnant, anxiety and depression, asthma, PCOS, IC. h/o lyme's disease and cat-scratch fever.  Pain update:  Sexual activity/pain: Has pain with intercourse that has been constant for at least 12 years.   Location of pain: LB and L>R hip (sitz bones) Current pain: 1/10 (2/10) Max pain: 7/10 (7/10) Least pain: 1/10 (1/10) Nature of pain:achy/dull and heaviness with loading.  ** "Less pain" than when she came in  Patient Goals: Not be in pain and have tools if her pain was to increase.   OBJECTIVE  Changes in: Posture/Observations:  PSIS appear level in standing.  Range of Motion/Flexibilty:  Following treatment pt. Demonstrates less L overhead reach ROM compared to R due to release of L QL only. (from previous session)  Strength/MMT:  LE MMT:  Pelvic floor:  Abdominal:  Rectus-abdominus spasm (from prior session)  Palpation: TTP to B Iliopsoas   Gait Analysis:  INTERVENTIONS THIS SESSION: Manual: TP release toB Iliopsoas to decrease spasm and pain and allow for improved balance of musculature for improved function and decreased symptoms. Educated on how to perform  self TP release with a tennis ball to common hip/pelvis trouble muscles using a tennis ball to decrease spasm and pain and allow for improved balance of musculature for improved function and decreased symptoms.  Therex: Educated on and practiced standing hip EXT and ABD to improve strength of muscles opposing tight musculature to allow reciprocal inhibition to improve balance of musculature surrounding the pelvis and improve overall posture for optimal musculature length-tension relationship and function.    Total time: 60  min.                                PT Short Term Goals - 11/08/20 1509      PT SHORT TERM GOAL #1   Title Patient will demonstrate improved pelvic alignment and balance of musculature surrounding the pelvis to facilitate decreased PFM spasms and decrease pelvic pain.    Baseline Pt. deomnstrates L up-slip and spasms surrounding L>R hip.    Time 5    Period Weeks    Status New    Target Date 12/13/20      PT SHORT TERM GOAL #2   Title Patient will demonstrate HEP x1 in the clinic to demonstrate understanding and proper form to allow for further improvement.    Baseline Pt. lacks knowledge of which therapeutic exercise can help decrease her Sx.    Time 5    Period Weeks    Status New    Target Date 12/13/20      PT SHORT TERM GOAL #3   Title Patient will demonstrate improved sitting and standing posture to demonstrate learning and decrease stress on the pelvic floor with functional activity.    Baseline hyperkyphotic/lordotic posture with decreased thoracic mobility and diastasis    Time 5    Period Weeks    Status New    Target Date 12/13/20             PT Long Term Goals - 11/08/20 1514      PT LONG TERM GOAL #1   Title Patient will report no episodes of SUI over the course of the prior two weeks to demonstrate improved functional ability.    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    Time 10    Period Weeks    Status New    Target Date 01/17/21      PT LONG TERM GOAL #2   Title Patient will report no pain with intercourse to demonstrate improved functional ability and demonstrate decreased PFM tension and decreased risk of tearing or delivery complications..    Baseline Pt. has had pain with intercourse for at least 12 years.    Time 10    Period Weeks    Status New    Target Date 01/17/21      PT LONG TERM GOAL #3   Title Patient will report having BMs at least every-other day with consistency between [facility] stool scale 3-5 over  the prior week to demonstrate decreased constipation.    Baseline Pt. able to go every 3-4 days but needs to use magnesium to get things moving often. h/o 3-4 weeks between BM's    Time 10    Period Weeks    Status New    Target Date 01/17/21      PT LONG TERM GOAL #4   Title Pt. will improve in FOTO score by 10 points or maximal score in each category to demonstrate improved  function.    Baseline PFDIPain: 54, PFDI Urinary: 17, Bowel Cnst: 68, Urinary Problem: 59, PFDI Bowel: 4    Time 10    Period Weeks    Status New    Target Date 01/17/21                 Plan - 12/03/20 1202    Clinical Impression Statement Pt. Responded well to all interventions today, demonstrating decreased spasm and pain, increased understanding of reciprocal inhibition, as well as understanding and correct performance of all education and exercises provided today. They will continue to benefit from skilled physical therapy to work toward remaining goals and maximize function as well as decrease likelihood of symptom increase or recurrence.    PT Next Visit Plan measure for LLD, TPDN to L psoas and glute Med/Max PRN, review standing posture and hip ABD/EXT. Tape for DR. perform mobs to upper back and teach scap retraction and chin-tucks, show posturific and taping for posture. discuss PFM assessment/perform externally/over clothes if OK    PT Home Exercise Plan diaphragmatic breathing, side-stretch, seated pelvic tilts, jiggles, supine pelvic tilts, standing hip EXT/ABD and self TP release with tennis ball.    Consulted and Agree with Plan of Care Patient           Patient will benefit from skilled therapeutic intervention in order to improve the following deficits and impairments:     Visit Diagnosis: Sacrococcygeal disorders, not elsewhere classified  Other muscle spasm  Abnormal posture     Problem List Patient Active Problem List   Diagnosis Date Noted   Supervision of high risk  pregnancy, antepartum 09/17/2020   History of preterm delivery, currently pregnant 09/17/2020   Interstitial cystitis 03/12/2017   Allergic rhinitis 02/26/2015   Anxiety disorder 02/26/2015   Chronic UTI 02/26/2015   Eczema 02/26/2015   Intermittent asthma, uncomplicated 02/26/2015   PCOS (polycystic ovarian syndrome) 02/26/2015   Depression 02/18/2012   Cleophus Molt DPT, ATC Cleophus Molt 12/03/2020, 12:05 PM  [place] [facility] MAIN [facility] SERVICES [address] [place], [place], [address] Phone: [phone]   Fax:  [phone]  Name: TIFFANI KADOW MRN: [id number] Date of Birth: 1986-03-09

## 2020-12-05 ENCOUNTER — Ambulatory Visit (INDEPENDENT_AMBULATORY_CARE_PROVIDER_SITE_OTHER): Payer: BC Managed Care – PPO

## 2020-12-05 ENCOUNTER — Ambulatory Visit (INDEPENDENT_AMBULATORY_CARE_PROVIDER_SITE_OTHER): Payer: BC Managed Care – PPO | Admitting: Obstetrics and Gynecology

## 2020-12-05 ENCOUNTER — Other Ambulatory Visit: Payer: Self-pay

## 2020-12-05 VITALS — BP 118/68 | Wt 172.0 lb

## 2020-12-05 DIAGNOSIS — O0990 Supervision of high risk pregnancy, unspecified, unspecified trimester: Secondary | ICD-10-CM | POA: Diagnosis not present

## 2020-12-05 DIAGNOSIS — Z363 Encounter for antenatal screening for malformations: Secondary | ICD-10-CM

## 2020-12-05 DIAGNOSIS — Z3A20 20 weeks gestation of pregnancy: Secondary | ICD-10-CM

## 2020-12-05 DIAGNOSIS — Z3A19 19 weeks gestation of pregnancy: Secondary | ICD-10-CM

## 2020-12-05 DIAGNOSIS — J4520 Mild intermittent asthma, uncomplicated: Secondary | ICD-10-CM

## 2020-12-05 DIAGNOSIS — O09899 Supervision of other high risk pregnancies, unspecified trimester: Secondary | ICD-10-CM

## 2020-12-05 LAB — POCT URINALYSIS DIPSTICK OB
Glucose, UA: NEGATIVE
POC,PROTEIN,UA: NEGATIVE

## 2020-12-05 NOTE — Progress Notes (Signed)
Routine Prenatal Care Visit  Subjective  Kaitlin [name] is a 34 y.o. G2P0101 at [redacted]w[redacted]d being seen today for ongoing prenatal care.  She is currently monitored for the following issues for this low-risk pregnancy and has Allergic rhinitis; Anxiety disorder; Chronic UTI; Depression; Eczema; Intermittent asthma, uncomplicated; PCOS (polycystic ovarian syndrome); Interstitial cystitis; Supervision of high risk pregnancy, antepartum; and History of preterm delivery, currently pregnant on their problem list.  ----------------------------------------------------------------------------------- Patient reports no complaints.   Contractions: Not present. Vag. Bleeding: None.  Movement: Present. Denies leaking of fluid.  ----------------------------------------------------------------------------------- The following portions of the patient's history were reviewed and updated as appropriate: allergies, current medications, past family history, past medical history, past social history, past surgical history and problem list. Problem list updated.   Objective  Blood pressure 118/68, weight 172 lb (78 kg), last menstrual period 07/20/2020, unknown if currently breastfeeding. Pregravid weight 162 lb (73.5 kg) Total Weight Gain 10 lb (4.536 kg) Urinalysis:      Fetal Status: Fetal Heart Rate (bpm): 150   Movement: Present     General:  Alert, oriented and cooperative. Patient is in no acute distress.  Skin: Skin is warm and dry. No rash noted.   Cardiovascular: Normal heart rate noted  Respiratory: Normal respiratory effort, no problems with respiration noted  Abdomen: Soft, gravid, appropriate for gestational age. Pain/Pressure: Absent     Pelvic:  Cervical exam deferred        Extremities: Normal range of motion.     ental Status: Normal mood and affect. Normal behavior. Normal judgment and thought content.   US OB Comp + 14 Wk  Result Date: 12/05/2020 Patient Name: Kaitlin [name] DOB:  01/04/1986 MRN: [id number] ULTRASOUND REPORT Location: [facility] Date of Service: 12/05/2020 Indications:Anatomy Ultrasound Findings: Mason Jim intrauterine pregnancy is visualized with FHR at 153 BPM. Biometrics give an (U/S) Gestational age of [redacted]w[redacted]d and an (U/S) EDD of 04/24/2021; this correlates with the clinically established Estimated Date of Delivery: 04/26/21 Fetal presentation is Breech. EFW: 333 g ( 12 oz ). Placenta: anterior. Grade: 1 AFI: subjectively normal. Anatomic survey is complete and normal; Gender - female.  Impression: 1. [redacted]w[redacted]d Viable Singleton Intrauterine pregnancy by U/S. 2. (U/S) EDD is consistent with Clinically established Estimated Date of Delivery: 04/26/21 . 3. Normal Anatomy Scan Recommendations: 1. Clinical correlation with the patient's History and Physical Exam. Deanna Artis, RT  There is a singleton gestation with subjectively normal amniotic fluid volume. The fetal biometry correlates with established dating. Detailed evaluation of the fetal anatomy was performed.The fetal anatomical survey appears within normal limits within the resolution of ultrasound as described above.  It must be noted that a normal ultrasound is unable to rule out fetal aneuploidy, subtle defects such as small ASD or VDS may also not be visible on imaging.  [name], MD, [name], [facility] 12/05/2020, 8:50 AM  US OB Transvaginal  Result Date: 11/07/2020 Patient Name: Kaitlin [name] DOB: 1985/12/21 MRN: [id number] ULTRASOUND REPORT Location: [facility] Date of Service: 11/07/2020 Indications:Previous preterm delivery Findings: Mason Jim intrauterine pregnancy is visualized. FHR: 153 BPM Stomach:  visualized Kidneys: visualized Bladder: visualized Transvaginal cervical length performed. Cervical length measures  3.56 cm in the shortest dimension. There is no change with fundal pressure. No funneling is present. Impression: 1. Unknown viable Singleton  Intrauterine pregnancy by previously established criteria. 2. Cervical length is 3.56 cm. Recommendations: 1.Clinical correlation with the patient's History and Physical Exam. Deanna Artis,  RT Normal cervical length ultrasound today.  [name], MD, Evern [facility], [facility] 11/07/2020, 11:46 AM  Immunization History  Administered Date(s) Administered  . Influenza,inj,Quad PF,6+ Mos 09/17/2020  . PFIZER SARS-COV-2 Vaccination 07/07/2020, 07/28/2020     Assessment   34 y.o. G2P0101 at [redacted]w[redacted]d by  04/26/2021, by Last Menstrual Period presenting for routine prenatal visit  Plan   SECOND Problems (from 09/17/20 to present)    Problem Noted Resolved   Supervision of high risk pregnancy, antepartum 09/17/2020 by [name], MD No   Overview Addendum 10/26/2020  9:03 AM by [name], MD    Clinic [facility] Prenatal Labs  Dating LMP = 10 week [place] Blood type: A/Positive/-- (10/11 1209)   Genetic Screen Inheritest negative SMA, CF, and Fragile-X     NIPS: Normal XX Antibody:Negative (10/11 1209)  Anatomic [place] Normal Rubella: 3.49 (10/11 1209) Varicella: Immune  GTT Early:               Third trimester:  RPR: Non Reactive (10/11 1209)   Rhogam  HBsAg: Negative (10/11 1209)   TDaP vaccine                       Flu Shot: COVID vaccinated HIV: Non Reactive (10/11 1209)   Baby Food                                GBS:   Contraception  Pap: 09/17/2020 NILM HPV negative  CBB     CS/VBAC    Support Person            Previous Version   History of preterm delivery, currently pregnant 09/17/2020 by [name], MD No   Intermittent asthma, uncomplicated 02/26/2015 by Shanda Bumps No   Preterm delivery, delivered 04/22/2017 by Conard Novak, MD 10/19/2020 by [name], MD       Gestational age appropriate obstetric precautions including but not limited to vaginal bleeding, contractions, leaking of fluid and fetal movement were  reviewed in detail with the patient.    Return in about 4 weeks (around 01/02/2021) for ROB.  [name], MD, Evern Core [facility], [facility] 12/05/2020, 9:12 AM

## 2020-12-10 ENCOUNTER — Other Ambulatory Visit: Payer: Self-pay

## 2020-12-10 ENCOUNTER — Ambulatory Visit: Payer: BC Managed Care – PPO | Attending: Obstetrics and Gynecology

## 2020-12-10 DIAGNOSIS — M533 Sacrococcygeal disorders, not elsewhere classified: Secondary | ICD-10-CM | POA: Diagnosis present

## 2020-12-10 DIAGNOSIS — R293 Abnormal posture: Secondary | ICD-10-CM | POA: Diagnosis present

## 2020-12-10 DIAGNOSIS — M62838 Other muscle spasm: Secondary | ICD-10-CM | POA: Insufficient documentation

## 2020-12-10 NOTE — Therapy (Signed)
[place] MAIN [facility] SERVICES [address] [place], [place], [address] Phone: [phone]   Fax:  [phone]  Physical Therapy Treatment  The patient has been informed of current processes in place at Outpatient Rehab to protect patients from Covid-19 exposure including social distancing, schedule modifications, and new cleaning procedures. After discussing their particular risk with a therapist based on the patient's personal risk factors, the patient has decided to proceed with in-person therapy.  Patient Details  Name: Kaitlin [name] MRN: [id number] Date of Birth: 02/28/1986 No data recorded  Encounter Date: 12/10/2020   PT End of Session - 12/10/20 1110    Visit Number 5    Number of Visits 10    Date for PT Re-Evaluation 01/17/21    Authorization Type [employer]BS    Authorization Time Period 11/08/20 through 01/17/21    Authorization - Visit Number 5    Authorization - Number of Visits 10    PT Start Time 0830    PT Stop Time 0930    PT Time Calculation (min) 60 min    Activity Tolerance Patient tolerated treatment well;No increased pain    Behavior During Therapy WFL for tasks assessed/performed           Past Medical History:  Diagnosis Date  . Anxiety and depression   . Asthma   . Eczema   . Interstitial cystitis   . PCOS (polycystic ovarian syndrome)   . Premature labor before 37 weeks of gestation with delivery, delivered    PPROM at 34 weeks    Past Surgical History:  Procedure Laterality Date  . Excision of lymph node  02/24/2013   sublingual lymph node: Dr Nadeen Landau  . INTRAUTERINE DEVICE INSERTION  06/01/2017   [facility]  . WISDOM TOOTH EXTRACTION      There were no vitals filed for this visit.   Pelvic Floor Physical Therapy Treatment Note  SCREENING  Changes in medications, allergies, or medical history?: none    SUBJECTIVE  Patient reports: Doing pretty well. Doing the ball seems to be helping,  has had her son jiggling her for her hips some. Sleeping is really hard. Having a hard time getting comfortable. Has been "popping" a lot. Felt a pop in the LB yesterday and one higher today when she turned to look behind her.  Precautions:  [redacted] weeks pregnant, anxiety and depression, asthma, PCOS, IC. h/o lyme's disease and cat-scratch fever.  Pain update:  Sexual activity/pain: Has pain with intercourse that has been constant for at least 12 years.   Location of pain: LB and L>R hip (sitz bones) Current pain: 2/10 (0/10) Max pain: 5/10 (5/10) Least pain: 0/10 (0/10) Nature of pain:achy/dull and heaviness with loading.  ** feels "a lot better" through L side, some soreness in R hip crease remaining.  Patient Goals: Not be in pain and have tools if her pain was to increase.   OBJECTIVE  Changes in: Posture/Observations:  PSIS appear level in standing.  Range of Motion/Flexibilty:  Following treatment pt. Demonstrates less L overhead reach ROM compared to R due to release of L QL only. (from previous session)  Strength/MMT:  LE MMT:  Pelvic floor:  Abdominal:  Rectus-abdominus spasm (from prior session)  Palpation: TTP to L OI, Psoas, adductors, glutes and Piriformis.  Gait Analysis:  INTERVENTIONS THIS SESSION: Manual: TP release to L OI and Psoas to decrease spasm and pain and allow for improved balance of musculature for improved function and decreased  symptoms.   Dry-needle: Performed TPDN with a .30x43mm needle and standard approach as described below to decrease spasm and pain and allow for improved balance of musculature for improved function and decreased symptoms.  Therex: Reviewed standing hip EXT and ABD and educated on hip-flexor stretch and cat-cow to improve strength of muscles opposing tight musculature to allow reciprocal inhibition to improve balance of musculature surrounding the pelvis and improve overall posture for optimal musculature  length-tension relationship and function.    Total time: 60 min.                       Trigger Point Dry Needling - 12/10/20 0001    Consent Given? Yes    Education Handout Provided No    Muscles Treated Back/Hip Obturator internus;Iliopsoas    Obturator internus Response Twitch response elicited;Palpable increased muscle length    Iliopsoas Response Twitch response elicited;Palpable increased muscle length                  PT Short Term Goals - 11/08/20 1509      PT SHORT TERM GOAL #1   Title Patient will demonstrate improved pelvic alignment and balance of musculature surrounding the pelvis to facilitate decreased PFM spasms and decrease pelvic pain.    Baseline Pt. deomnstrates L up-slip and spasms surrounding L>R hip.    Time 5    Period Weeks    Status New    Target Date 12/13/20      PT SHORT TERM GOAL #2   Title Patient will demonstrate HEP x1 in the clinic to demonstrate understanding and proper form to allow for further improvement.    Baseline Pt. lacks knowledge of which therapeutic exercise can help decrease her Sx.    Time 5    Period Weeks    Status New    Target Date 12/13/20      PT SHORT TERM GOAL #3   Title Patient will demonstrate improved sitting and standing posture to demonstrate learning and decrease stress on the pelvic floor with functional activity.    Baseline hyperkyphotic/lordotic posture with decreased thoracic mobility and diastasis    Time 5    Period Weeks    Status New    Target Date 12/13/20             PT Long Term Goals - 11/08/20 1514      PT LONG TERM GOAL #1   Title Patient will report no episodes of SUI over the course of the prior two weeks to demonstrate improved functional ability.    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    Time 10    Period Weeks    Status New    Target Date 01/17/21      PT LONG TERM GOAL #2   Title Patient will report no pain with intercourse to demonstrate improved  functional ability and demonstrate decreased PFM tension and decreased risk of tearing or delivery complications..    Baseline Pt. has had pain with intercourse for at least 12 years.    Time 10    Period Weeks    Status New    Target Date 01/17/21      PT LONG TERM GOAL #3   Title Patient will report having BM's at least every-other day with consistency between [facility] stool scale 3-5 over the prior week to demonstrate decreased constipation.    Baseline Pt. able to go every 3-4 days but needs to use  magnesium to get things moving often. h/o 3-4 weeks between BM's    Time 10    Period Weeks    Status New    Target Date 01/17/21      PT LONG TERM GOAL #4   Title Pt. will improve in FOTO score by 10 points or maximal score in each category to demonstrate improved function.    Baseline PFDIPain: 54, PFDI Urinary: 17, Bowel Cnst: 68, Urinary Problem: 59, PFDI Bowel: 4    Time 10    Period Weeks    Status New    Target Date 01/17/21                 Plan - 12/10/20 1111    Clinical Impression Statement Pt. Responded well to all interventions today, demonstrating decreased spasm and pain as well as understanding and correct performance of all education and exercises provided today. They will continue to benefit from skilled physical therapy to work toward remaining goals and maximize function as well as decrease likelihood of symptom increase or recurrence.   PT Next Visit Plan measure for LLD, TPDN to L glute Med/Max PRN, review standing posture and hip ABD/EXT. Tape for DR. perform mobs to upper back and teach scap retraction and chin-tucks, show posturific and taping for posture. discuss PFM assessment/perform externally/over clothes if OK    PT Home Exercise Plan diaphragmatic breathing, side-stretch, seated pelvic tilts, jiggles, supine pelvic tilts, standing hip EXT/ABD and self TP release with tennis ball, bowel retraining, colonic massage, hip-flexor stretch, cat-cow.     Consulted and Agree with Plan of Care Patient           Patient will benefit from skilled therapeutic intervention in order to improve the following deficits and impairments:     Visit Diagnosis: Sacrococcygeal disorders, not elsewhere classified  Other muscle spasm  Abnormal posture     Problem List Patient Active Problem List   Diagnosis Date Noted  . Supervision of high risk pregnancy, antepartum 09/17/2020  . History of preterm delivery, currently pregnant 09/17/2020  . Interstitial cystitis 03/12/2017  . Allergic rhinitis 02/26/2015  . Anxiety disorder 02/26/2015  . Chronic UTI 02/26/2015  . Eczema 02/26/2015  . Intermittent asthma, uncomplicated 02/26/2015  . PCOS (polycystic ovarian syndrome) 02/26/2015  . Depression 02/18/2012   Cleophus Molt DPT, ATC Cleophus Molt 12/10/2020, 11:20 AM  [place] [facility] MAIN [facility] SERVICES [address] [place], [place], [address] Phone: [phone]   Fax:  [phone]  Name: Kaitlin [name] MRN: [id number] Date of Birth: July 17, 1986

## 2020-12-10 NOTE — Patient Instructions (Signed)
Cat / Cow Flow    Exhale, press spine toward ceiling like a Halloween cat. Keeping strength in arms and abdominals, Inhale to soften spine through neutral and into cow pose. Open chest and arch back. Initiate movement between cat and cow at tailbone, one vertebrae at a time. Repeat __30__ times.  Bowel retraining program: 1) Start by drinking a hot (optionally caffeinated) beverage 2) Do your "I love you" colonic massage  3) Go for a short walk 4) Go to the toilet and sit with feet up on squatty potty and relaxing forward on your knees with tall spine. Take deep, lengthening, breaths and allow up to 10 minutes to have a BM without "straining" before you move on with the day.    The "I Love You" massage for your colon  Start by resting or lying quietly. 1. Using your fingertips, you apply light pressure in a stroking motion. 2. Start with your hands on the left hand side of your abdomen, below the rib cage, and stroke or make small circles down towards your left hip. This is the "I" of the "I Love You" massage. 3. Next, you are going to make the strokes in an upside down "L" shape. Run your fingertips from the right side of your upper abdomen, across under your ribs, and down the left side. 4. Now you are going to run through the whole path. This is the "U". Start on the bottom right of your abdomen. Stroke up the right side, across under the rib cage, and down the left side.   5. Finally, Using your fingertips, you apply light pressure in small circles  through the whole "U" path to "wake up" the smooth muscles of the intestines and get things moving.    Up the right, across under the rib cage, down the left and inwards, moving in a clockwise motion (if you are looking down upon your own abdomen)  Essentially you are massaging along the path of your large intestine. Our colon starts roughly in the bottom right of our abdomen, travels up the right hand side, turns and runs across below our  rib cage, and then down the left side and in towards the pubic bone. When I teach this massage for people to do at home I have them start with 10 minutes. However, anecdotally, many people tell me 15-20 minutes really gets things going! After about 5 minutes of this massage my insides start gurgling and making noises. 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 A big problem is constipation resulting from either medication side effects, post surgical changes, or the fact that in general people in the hospital don't move as much (and exercise such as walking also helps regulate our digestive system). One of the first "exercises" I would teach them is how to do the "I Love You" abdominal massage. Time and time again I have people come back to me and say that massaging their abdominal tissue helped their digestive issues. Give it a try today!  *Adapted from article written by Harriet Butte, PT, DPT   Flexors, Lunge  Hip Flexor Stretch: Proposal Pose  (can be done in standing lunge too) Maintain pelvic tuck under, lift pubic bone toward navel. Engage posterior hip muscles (firm glute muscles of leg in back position) and shift forward until you feel stretch on front of leg that is down. To increase stretch, maintain balance and ease hips forward. You may use one hand on  a chair for balance if needed. Hold for __5__ breaths. Repeat __2-3__ times each leg.  Do _1-2__ times per day.

## 2020-12-17 ENCOUNTER — Ambulatory Visit: Payer: BC Managed Care – PPO

## 2020-12-31 ENCOUNTER — Other Ambulatory Visit: Payer: Self-pay

## 2020-12-31 ENCOUNTER — Ambulatory Visit: Payer: BC Managed Care – PPO

## 2020-12-31 DIAGNOSIS — M62838 Other muscle spasm: Secondary | ICD-10-CM

## 2020-12-31 DIAGNOSIS — M533 Sacrococcygeal disorders, not elsewhere classified: Secondary | ICD-10-CM | POA: Diagnosis not present

## 2020-12-31 DIAGNOSIS — R293 Abnormal posture: Secondary | ICD-10-CM

## 2020-12-31 NOTE — Therapy (Signed)
[place] MAIN [facility] SERVICES [address] [place], [place], [address] Phone: [phone]   Fax:  [phone]  Physical Therapy Treatment  The patient has been informed of current processes in place at Outpatient Rehab to protect patients from Covid-19 exposure including social distancing, schedule modifications, and new cleaning procedures. After discussing their particular risk with a therapist based on the patient's personal risk factors, the patient has decided to proceed with in-person therapy.  Patient Details  Name: Kaitlin [name] MRN: [id number] Date of Birth: [birth year] No data recorded  Encounter Date: 12/31/2020   PT End of Session - 12/31/20 1039    Visit Number 6    Number of Visits 10    Date for PT Re-Evaluation 01/17/21    Authorization Type BC[employer]    Authorization Time Period 11/08/20 through 01/17/21    Authorization - Visit Number 6    Authorization - Number of Visits 10    PT Start Time 0830    PT Stop Time 0935    PT Time Calculation (min) 65 min    Activity Tolerance Patient tolerated treatment well;No increased pain    Behavior During Therapy WFL for tasks assessed/performed           Past Medical History:  Diagnosis Date  . Anxiety and depression   . Asthma   . Eczema   . Interstitial cystitis   . PCOS (polycystic ovarian syndrome)   . Premature labor before 37 weeks of gestation with delivery, delivered    PPROM at 34 weeks    Past Surgical History:  Procedure Laterality Date  . Excision of lymph node  02/24/2013   sublingual lymph node: Dr Nadeen Landau  . INTRAUTERINE DEVICE INSERTION  06/01/2017   [facility]  . WISDOM TOOTH EXTRACTION      There were no vitals filed for this visit.  Pelvic Floor Physical Therapy Treatment Note  SCREENING  Changes in medications, allergies, or medical history?: none    SUBJECTIVE  Patient reports: Walking in it feels like her inner thighs are bruised, she  is having the pain with sitting still in the bottom. Starts by being able to sit for ~ 2 hours and then has to sit for shorter and shorter periods throughout the day.   Precautions:  [redacted] weeks pregnant, anxiety and depression, asthma, PCOS, IC. h/o lyme's disease and cat-scratch fever.  Pain update:  Sexual activity/pain: Has pain with intercourse that has been constant for at least 12 years.   Location of pain: LB and L>R hip (sitz bones) Current pain: 2/10 (0/10) Max pain: 6/10 (6/10) Least pain: 0/10 (0/10) Nature of pain:achy/dull and heaviness with loading.  **0/10 following treatment.  Patient Goals: Not be in pain and have tools if her pain was to increase.   OBJECTIVE  Changes in: Posture/Observations:  L anterior rotation   **WNL following treatment  Range of Motion/Flexibilty:  Following treatment pt. Demonstrates less L overhead reach ROM compared to R due to release of L QL only. (from previous session)  Strength/MMT:  LE MMT:  Pelvic floor:  Abdominal:  Rectus-abdominus spasm (from prior session)  Palpation: TTP to B adductor brevis and rectus abdominus insertion.   Gait Analysis:  INTERVENTIONS THIS SESSION: Manual: TP release to B adductor brevis and rectus abdominus insertion followed by MET correction for L anterior rotation x2 to decrease spasm and pain and allow for improved pelvic alignment and balance of musculature for improved function and decreased symptoms.  Dry-needle: Performed TPDN with a .30x172m needle and standard approach as described below to decrease spasm and pain and allow for improved balance of musculature for improved function and decreased symptoms.  Therex: Educated on and practiced Butterfly and v-sit options for adductor stretch To maintain and improve muscle length and allow for improved balance of musculature for long-term symptom relief.   Total time: 65 min.                        Trigger  Point Dry Needling - 12/31/20 0001    Consent Given? Yes    Education Handout Provided No    Muscles Treated Lower Quadrant Adductor longus/brevis/magnus    Dry Needling Comments Bilateral    Adductor Response Twitch response elicited;Palpable increased muscle length                  PT Short Term Goals - 11/08/20 1509      PT SHORT TERM GOAL #1   Title Patient will demonstrate improved pelvic alignment and balance of musculature surrounding the pelvis to facilitate decreased PFM spasms and decrease pelvic pain.    Baseline Pt. deomnstrates L up-slip and spasms surrounding L>R hip.    Time 5    Period Weeks    Status New    Target Date 12/13/20      PT SHORT TERM GOAL #2   Title Patient will demonstrate HEP x1 in the clinic to demonstrate understanding and proper form to allow for further improvement.    Baseline Pt. lacks knowledge of which therapeutic exercise can help decrease her Sx.    Time 5    Period Weeks    Status New    Target Date 12/13/20      PT SHORT TERM GOAL #3   Title Patient will demonstrate improved sitting and standing posture to demonstrate learning and decrease stress on the pelvic floor with functional activity.    Baseline hyperkyphotic/lordotic posture with decreased thoracic mobility and diastasis    Time 5    Period Weeks    Status New    Target Date 12/13/20             PT Long Term Goals - 11/08/20 1514      PT LONG TERM GOAL #1   Title Patient will report no episodes of SUI over the course of the prior two weeks to demonstrate improved functional ability.    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    Time 10    Period Weeks    Status New    Target Date 01/17/21      PT LONG TERM GOAL #2   Title Patient will report no pain with intercourse to demonstrate improved functional ability and demonstrate decreased PFM tension and decreased risk of tearing or delivery complications..    Baseline Pt. has had pain with intercourse for  at least 12 years.    Time 10    Period Weeks    Status New    Target Date 01/17/21      PT LONG TERM GOAL #3   Title Patient will report having BM's at least every-other day with consistency [facility] 3-5 over the prior week to demonstrate decreased constipation.    Baseline Pt. able to go every 3-4 days but needs to use magnesium to get things moving often. h/o 3-4 weeks between BM's    Time 10    Period Weeks  Status New    Target Date 01/17/21      PT LONG TERM GOAL #4   Title Pt. will improve in FOTO score by 10 points or maximal score in each category to demonstrate improved function.    Baseline PFDIPain: 54, PFDI Urinary: 17, Bowel Cnst: 68, Urinary Problem: 59, PFDI Bowel: 4    Time 10    Period Weeks    Status New    Target Date 01/17/21                 Plan - 12/31/20 1039    Clinical Impression Statement Pt. Responded well to all interventions today, demonstrating improved pelvic alignment, decreased spasm and resolution of pain, as well as understanding and correct performance of all education and exercises provided today. They will continue to benefit from skilled physical therapy to work toward remaining goals and maximize function as well as decrease likelihood of symptom increase or recurrence.    PT Next Visit Plan measure for LLD, TPDN to L glute Med/Max PRN, review standing posture and hip ABD/EXT. Tape for DR. perform mobs to upper back and teach scap retraction and chin-tucks, show posturific and taping for posture. discuss PFM assessment/perform externally/over clothes if OK    PT Home Exercise Plan diaphragmatic breathing, side-stretch, seated pelvic tilts, jiggles, supine pelvic tilts, standing hip EXT/ABD and self TP release with tennis ball, bowel retraining, colonic massage, hip-flexor stretch, cat-cow, Butterfly/v-sit adductor stretches.    Consulted and Agree with Plan of Care Patient           Patient will benefit from  skilled therapeutic intervention in order to improve the following deficits and impairments:     Visit Diagnosis: Sacrococcygeal disorders, not elsewhere classified  Other muscle spasm  Abnormal posture     Problem List Patient Active Problem List   Diagnosis Date Noted  . Supervision of high risk pregnancy, antepartum 09/17/2020  . History of preterm delivery, currently pregnant 09/17/2020  . Interstitial cystitis 03/12/2017  . Allergic rhinitis 02/26/2015  . Anxiety disorder 02/26/2015  . Chronic UTI 02/26/2015  . Eczema 02/26/2015  . Intermittent asthma, uncomplicated 12/92/9090  . PCOS (polycystic ovarian syndrome) 02/26/2015  . Depression 02/18/2012   Willa Rough DPT, ATC Willa Rough 12/31/2020, 10:44 AM  [place] MAIN [facility] SERVICES [address] [place], [place], [address] Phone: [phone]   Fax:  [phone]  Name: RORIE DELMORE MRN: [id number] Date of Birth: [birth year]

## 2020-12-31 NOTE — Patient Instructions (Signed)
OR   Bring feet together and let the knees fall out to the sides. Hold for 5 deep breaths, rest then repeat 2 more times.

## 2021-01-04 ENCOUNTER — Ambulatory Visit (INDEPENDENT_AMBULATORY_CARE_PROVIDER_SITE_OTHER): Payer: BC Managed Care – PPO | Admitting: Obstetrics and Gynecology

## 2021-01-04 ENCOUNTER — Other Ambulatory Visit: Payer: Self-pay

## 2021-01-04 VITALS — BP 120/70 | Wt 178.0 lb

## 2021-01-04 DIAGNOSIS — O0990 Supervision of high risk pregnancy, unspecified, unspecified trimester: Secondary | ICD-10-CM

## 2021-01-04 DIAGNOSIS — Z3A24 24 weeks gestation of pregnancy: Secondary | ICD-10-CM

## 2021-01-04 DIAGNOSIS — O09899 Supervision of other high risk pregnancies, unspecified trimester: Secondary | ICD-10-CM

## 2021-01-04 NOTE — Progress Notes (Signed)
Routine Prenatal Care Visit  Subjective  Kaitlin [name] is a 35 y.o. G2P0101 at [redacted]w[redacted]d being seen today for ongoing prenatal care.  She is currently monitored for the following issues for this high-risk pregnancy and has Allergic rhinitis; Anxiety disorder; Chronic UTI; Depression; Eczema; Intermittent asthma, uncomplicated; PCOS (polycystic ovarian syndrome); Interstitial cystitis; Supervision of high risk pregnancy, antepartum; and History of preterm delivery, currently pregnant on their problem list.  ----------------------------------------------------------------------------------- Patient reports some pelvic pressure and feeling some kick low vaginal which is worriesome to her as she felt similar symptoms with her prior preterm delivery.   Contractions: Not present. Vag. Bleeding: None.  Movement: Present. Denies leaking of fluid.  ----------------------------------------------------------------------------------- The following portions of the patient's history were reviewed and updated as appropriate: allergies, current medications, past family history, past medical history, past social history, past surgical history and problem list. Problem list updated.   Objective  Blood pressure 120/70, weight 178 lb (80.7 kg), last menstrual period 07/20/2020, unknown if currently breastfeeding. Pregravid weight 162 lb (73.5 kg) Total Weight Gain 16 lb (7.258 kg) Urinalysis:      Fetal Status: Fetal Heart Rate (bpm): 150 Fundal Height: 24 cm Movement: Present     General:  Alert, oriented and cooperative. Patient is in no acute distress.  Skin: Skin is warm and dry. No rash noted.   Cardiovascular: Normal heart rate noted  Respiratory: Normal respiratory effort, no problems with respiration noted  Abdomen: Soft, gravid, appropriate for gestational age. Pain/Pressure: Absent     Pelvic:  Cervical exam performed Dilation: Closed Effacement (%): 0 Station: -3  Extremities: Normal range of  motion.     ental Status: Normal mood and affect. Normal behavior. Normal judgment and thought content.     Assessment   35 y.o. G2P0101 at [redacted]w[redacted]d by  04/26/2021, by Last Menstrual Period presenting for routine prenatal visit  Plan   SECOND Problems (from 09/17/20 to present)    Problem Noted Resolved   Supervision of high risk pregnancy, antepartum 09/17/2020 by [name], MD No   Overview Addendum 12/05/2020  8:53 AM by [name], MD    Clinic [facility] Prenatal Labs  Dating LMP = 10 week [place] Blood type: A/Positive/-- (10/11 1209)   Genetic Screen Inheritest negative SMA, CF, and Fragile-X     NIPS: Normal XX Antibody:Negative (10/11 1209)  Anatomic [place] Normal 12/05/2020 Rubella: 3.49 (10/11 1209) Varicella: Immune  GTT Early:               Third trimester:  RPR: Non Reactive (10/11 1209)   Rhogam N/A HBsAg: Negative (10/11 1209)   TDaP vaccine                       Flu Shot: COVID vaccinated  HIV: Non Reactive (10/11 1209)   Baby Food                                GBS:   Contraception  Pap: 09/17/2020 NILM HPV negative  CBB     CS/VBAC N/A   Support Person            Previous Version   History of preterm delivery, currently pregnant 09/17/2020 by [name], MD No   Intermittent asthma, uncomplicated 02/26/2015 by Shanda Bumps No   Preterm delivery, delivered 04/22/2017 by Conard Novak, MD 10/19/2020 by [name], MD  Gestational age appropriate obstetric precautions including but not limited to vaginal bleeding, contractions, leaking of fluid and fetal movement were reviewed in detail with the patient.    Return in about 4 weeks (around 02/01/2021) for ROB and 28 week labs, growth scan.  [name], MD, Evern Core [facility], [facility] 01/04/2021, 9:09 AM

## 2021-01-07 ENCOUNTER — Ambulatory Visit: Payer: BC Managed Care – PPO

## 2021-01-24 ENCOUNTER — Ambulatory Visit: Payer: BC Managed Care – PPO

## 2021-01-29 ENCOUNTER — Other Ambulatory Visit: Payer: Self-pay | Admitting: Obstetrics and Gynecology

## 2021-01-29 DIAGNOSIS — O0990 Supervision of high risk pregnancy, unspecified, unspecified trimester: Secondary | ICD-10-CM

## 2021-01-29 DIAGNOSIS — J4520 Mild intermittent asthma, uncomplicated: Secondary | ICD-10-CM

## 2021-01-29 DIAGNOSIS — O99210 Obesity complicating pregnancy, unspecified trimester: Secondary | ICD-10-CM

## 2021-01-29 DIAGNOSIS — O09899 Supervision of other high risk pregnancies, unspecified trimester: Secondary | ICD-10-CM

## 2021-02-01 ENCOUNTER — Other Ambulatory Visit: Payer: BC Managed Care – PPO

## 2021-02-01 ENCOUNTER — Ambulatory Visit (INDEPENDENT_AMBULATORY_CARE_PROVIDER_SITE_OTHER): Payer: BC Managed Care – PPO | Admitting: Obstetrics and Gynecology

## 2021-02-01 ENCOUNTER — Ambulatory Visit: Payer: BC Managed Care – PPO

## 2021-02-01 ENCOUNTER — Other Ambulatory Visit: Payer: Self-pay

## 2021-02-01 VITALS — BP 120/72 | Wt 180.0 lb

## 2021-02-01 DIAGNOSIS — O0990 Supervision of high risk pregnancy, unspecified, unspecified trimester: Secondary | ICD-10-CM

## 2021-02-01 DIAGNOSIS — J4520 Mild intermittent asthma, uncomplicated: Secondary | ICD-10-CM

## 2021-02-01 DIAGNOSIS — O09899 Supervision of other high risk pregnancies, unspecified trimester: Secondary | ICD-10-CM

## 2021-02-01 DIAGNOSIS — Z23 Encounter for immunization: Secondary | ICD-10-CM

## 2021-02-01 DIAGNOSIS — Z3A24 24 weeks gestation of pregnancy: Secondary | ICD-10-CM

## 2021-02-01 DIAGNOSIS — Z3A28 28 weeks gestation of pregnancy: Secondary | ICD-10-CM

## 2021-02-01 DIAGNOSIS — O99210 Obesity complicating pregnancy, unspecified trimester: Secondary | ICD-10-CM

## 2021-02-01 LAB — POCT URINALYSIS DIPSTICK OB
Glucose, UA: NEGATIVE
POC,PROTEIN,UA: NEGATIVE

## 2021-02-01 MED ORDER — SERTRALINE HCL 50 MG PO TABS: 50.0000 mg | ORAL_TABLET | Freq: Every day | ORAL | 2 refills | Status: DC

## 2021-02-01 NOTE — Progress Notes (Signed)
Routine Prenatal Care Visit  Subjective  Kaitlin [name] is a 35 y.o. G2P0101 at [redacted]w[redacted]d being seen today for ongoing prenatal care.  She is currently monitored for the following issues for this low-risk pregnancy and has Allergic rhinitis; Anxiety disorder; Chronic UTI; Depression; Eczema; Intermittent asthma, uncomplicated; PCOS (polycystic ovarian syndrome); Interstitial cystitis; Supervision of high risk pregnancy, antepartum; and History of preterm delivery, currently pregnant on their problem list.  ----------------------------------------------------------------------------------- Patient reports no complaints.   Contractions: Not present. Vag. Bleeding: None.  Movement: Present. Denies leaking of fluid.  ----------------------------------------------------------------------------------- The following portions of the patient's history were reviewed and updated as appropriate: allergies, current medications, past family history, past medical history, past social history, past surgical history and problem list. Problem list updated.   Objective  Blood pressure 120/72, weight 180 lb (81.6 kg), last menstrual period 07/20/2020, unknown if currently breastfeeding. Pregravid weight 162 lb (73.5 kg) Total Weight Gain 18 lb (8.165 kg) Urinalysis:      Fetal Status: Fetal Heart Rate (bpm): 140 Fundal Height: 28 cm Movement: Present     General:  Alert, oriented and cooperative. Patient is in no acute distress.  Skin: Skin is warm and dry. No rash noted.   Cardiovascular: Normal heart rate noted  Respiratory: Normal respiratory effort, no problems with respiration noted  Abdomen: Soft, gravid, appropriate for gestational age. Pain/Pressure: Absent     Pelvic:  Cervical exam deferred        Extremities: Normal range of motion.     ental Status: Normal mood and affect. Normal behavior. Normal judgment and thought content.     Assessment   35 y.o. G2P0101 at [redacted]w[redacted]d by  04/26/2021, by  Last Menstrual Period presenting for routine prenatal visit  Plan   SECOND Problems (from 09/17/20 to present)    Problem Noted Resolved   Supervision of high risk pregnancy, antepartum 09/17/2020 by [name], MD No   Overview Addendum 12/05/2020  8:53 AM by [name], MD    Clinic [facility] Prenatal Labs  Dating LMP = 10 week [place] Blood type: A/Positive/-- (10/11 1209)   Genetic Screen Inheritest negative SMA, CF, and Fragile-X     NIPS: Normal XX Antibody:Negative (10/11 1209)  Anatomic [place] Normal 12/05/2020 Rubella: 3.49 (10/11 1209) Varicella: Immune  GTT Early:               Third trimester:  RPR: Non Reactive (10/11 1209)   Rhogam N/A HBsAg: Negative (10/11 1209)   TDaP vaccine                       Flu Shot: COVID vaccinated  HIV: Non Reactive (10/11 1209)   Baby Food                                GBS:   Contraception  Pap: 09/17/2020 NILM HPV negative  CBB     CS/VBAC N/A   Support Person            Previous Version   History of preterm delivery, currently pregnant 09/17/2020 by [name], MD No   Intermittent asthma, uncomplicated 02/26/2015 by Shanda Bumps No   Preterm delivery, delivered 04/22/2017 by Conard Novak, MD 10/19/2020 by [name], MD       Gestational age appropriate obstetric precautions including but not limited to vaginal bleeding, contractions, leaking of fluid and fetal movement were reviewed in  detail with the patient.    - 28 week labs and TDAP today - anxiety start Zoloft 50mg  po daily  Return in about 2 weeks (around 02/15/2021) for ROB and growth scan if we can do it on a Tuesday Wednesday here in clinic.  Saturday, MD, [name] [facility], [facility] 02/01/2021, 10:44 AM

## 2021-02-01 NOTE — Addendum Note (Signed)
Addended by: Fortunato Curling R on: 02/01/2021 10:56 AM   Modules accepted: Orders

## 2021-02-02 LAB — 28 WEEK RH+PANEL
Basophils Absolute: 0 10*3/uL (ref 0.0–0.2)
Basos: 0 %
EOS (ABSOLUTE): 0 10*3/uL (ref 0.0–0.4)
Eos: 0 %
Gestational Diabetes Screen: 137 mg/dL (ref 65–139)
HIV Screen 4th Generation wRfx: NONREACTIVE
Hematocrit: 41.5 % (ref 34.0–46.6)
Hemoglobin: 13.7 g/dL (ref 11.1–15.9)
Immature Grans (Abs): 0.1 10*3/uL (ref 0.0–0.1)
Immature Granulocytes: 1 %
Lymphocytes Absolute: 2.2 10*3/uL (ref 0.7–3.1)
Lymphs: 23 %
MCH: 29.7 pg (ref 26.6–33.0)
MCHC: 33 g/dL (ref 31.5–35.7)
MCV: 90 fL (ref 79–97)
Monocytes Absolute: 0.4 10*3/uL (ref 0.1–0.9)
Monocytes: 4 %
Neutrophils Absolute: 6.7 10*3/uL (ref 1.4–7.0)
Neutrophils: 72 %
Platelets: 256 10*3/uL (ref 150–450)
RBC: 4.62 x10E6/uL (ref 3.77–5.28)
RDW: 12.2 % (ref 11.7–15.4)
RPR Ser Ql: NONREACTIVE
WBC: 9.4 10*3/uL (ref 3.4–10.8)

## 2021-02-08 ENCOUNTER — Ambulatory Visit: Payer: BC Managed Care – PPO

## 2021-02-13 ENCOUNTER — Ambulatory Visit (INDEPENDENT_AMBULATORY_CARE_PROVIDER_SITE_OTHER): Payer: BC Managed Care – PPO | Admitting: Obstetrics & Gynecology

## 2021-02-13 ENCOUNTER — Other Ambulatory Visit: Payer: Self-pay

## 2021-02-13 ENCOUNTER — Encounter: Payer: Self-pay | Admitting: Obstetrics & Gynecology

## 2021-02-13 ENCOUNTER — Ambulatory Visit (INDEPENDENT_AMBULATORY_CARE_PROVIDER_SITE_OTHER): Payer: BC Managed Care – PPO

## 2021-02-13 VITALS — BP 110/70 | Wt 181.0 lb

## 2021-02-13 DIAGNOSIS — J4520 Mild intermittent asthma, uncomplicated: Secondary | ICD-10-CM

## 2021-02-13 DIAGNOSIS — O09899 Supervision of other high risk pregnancies, unspecified trimester: Secondary | ICD-10-CM

## 2021-02-13 DIAGNOSIS — O0993 Supervision of high risk pregnancy, unspecified, third trimester: Secondary | ICD-10-CM

## 2021-02-13 DIAGNOSIS — Z3A29 29 weeks gestation of pregnancy: Secondary | ICD-10-CM

## 2021-02-13 DIAGNOSIS — O0990 Supervision of high risk pregnancy, unspecified, unspecified trimester: Secondary | ICD-10-CM

## 2021-02-13 DIAGNOSIS — O99213 Obesity complicating pregnancy, third trimester: Secondary | ICD-10-CM | POA: Insufficient documentation

## 2021-02-13 DIAGNOSIS — Z3A30 30 weeks gestation of pregnancy: Secondary | ICD-10-CM | POA: Diagnosis not present

## 2021-02-13 LAB — POCT URINALYSIS DIPSTICK OB
Glucose, UA: NEGATIVE
POC,PROTEIN,UA: NEGATIVE

## 2021-02-13 NOTE — Progress Notes (Signed)
  Subjective  Fetal Movement? yes Contractions? no Leaking Fluid? no Vaginal Bleeding? no  Discussed prior PTD after PPROM at 34 weeks, preceded by GBS UTI 33 weeks    Pt had declined 17OHP therapy    Monitoring for s/sx PTL, none at this time    Monitor and treat UTI sx's, none at this time  Objective  BP 110/70   Wt 181 lb (82.1 kg)   LMP 07/20/2020   BMI 32.06 kg/m  General: NAD Pumonary: no increased work of breathing Abdomen: gravid, non-tender Extremities: no edema Psychiatric: mood appropriate, affect full  Assessment  35 y.o. G2P0101 at [redacted]w[redacted]d by  04/26/2021, by Last Menstrual Period presenting for routine prenatal visit  Plan   Problem List Items Addressed This Visit      Respiratory   Intermittent asthma, uncomplicated     Other   Supervision of high risk pregnancy, antepartum   History of preterm delivery, currently pregnant   Obesity complicating pregnancy in third trimester    Other Visit Diagnoses    [redacted] weeks gestation of pregnancy    -  Primary      SECOND Problems (from 09/17/20 to present)    Problem Noted Resolved   Supervision of high risk pregnancy, antepartum     Overview Addendum 12/05/2020  8:53 AM by [name], MD    Clinic [facility] Prenatal Labs  Dating LMP = 10 week [place] Blood type: A/Positive/-- (10/11 1209)   Genetic Screen Inheritest negative SMA, CF, and Fragile-X     NIPS: Normal XX Antibody:Negative (10/11 1209)  Anatomic [place] Normal 12/05/2020 Rubella: 3.49 (10/11 1209) Varicella: Immune  GTT Early:               Third trimester:  RPR: Non Reactive (10/11 1209)   Rhogam N/A HBsAg: Negative (10/11 1209)   TDaP vaccine                       Flu Shot: COVID vaccinated  HIV: Non Reactive (10/11 1209)   Baby Food                                GBS:   Contraception  Pap: 09/17/2020 NILM HPV negative  CBB     CS/VBAC N/A   Support Person         Previous Version   History of preterm delivery, currently pregnant      Intermittent asthma, uncomplicated     Preterm delivery, delivered       PNV, FMC, PTL PRECAUTIONS COUNSELED TO Patient  Review of ULTRASOUND.    I have personally reviewed images and report of recent ultrasound done at [facility].    Plan of management to be discussed with patient.    Growth discussed, normal AFI, Breech at this time  Annamarie Major, MD, [name], [facility] 02/13/2021  9:39 AM

## 2021-02-13 NOTE — Addendum Note (Signed)
Addended by: Cornelius Moras D on: 02/13/2021 09:43 AM   Modules accepted: Orders

## 2021-02-13 NOTE — Patient Instructions (Signed)

## 2021-02-23 ENCOUNTER — Other Ambulatory Visit: Payer: Self-pay | Admitting: Obstetrics and Gynecology

## 2021-02-27 ENCOUNTER — Telehealth: Payer: Self-pay

## 2021-02-27 NOTE — Telephone Encounter (Signed)
Pt calling; is 31wks; is having diarrhea non-stop x3d; started on Saturday, not sure about Sunday but Mon, Tues and today.  [phone]  Pt states she feels like her cx is spasming but not sure; pt had diarrhea before delivering prematurely so she is uneasy about this; has appt Fri; adv kaopectate or Immodium or [place] PG; to sip clear liquids and let system rest for 24hrs; then bland food for 24hrs; the work up to reg diet as tolerated; discuss c AMS at appt Fri.

## 2021-03-01 ENCOUNTER — Ambulatory Visit (INDEPENDENT_AMBULATORY_CARE_PROVIDER_SITE_OTHER): Payer: BC Managed Care – PPO | Admitting: Obstetrics and Gynecology

## 2021-03-01 ENCOUNTER — Other Ambulatory Visit: Payer: Self-pay

## 2021-03-01 DIAGNOSIS — O09899 Supervision of other high risk pregnancies, unspecified trimester: Secondary | ICD-10-CM

## 2021-03-01 DIAGNOSIS — O0990 Supervision of high risk pregnancy, unspecified, unspecified trimester: Secondary | ICD-10-CM

## 2021-03-01 DIAGNOSIS — O99213 Obesity complicating pregnancy, third trimester: Secondary | ICD-10-CM

## 2021-03-01 LAB — POCT URINALYSIS DIPSTICK OB
Glucose, UA: NEGATIVE
POC,PROTEIN,UA: NEGATIVE

## 2021-03-01 NOTE — Progress Notes (Signed)
Routine Prenatal Care Visit  Subjective  Kaitlin [name] is a 35 y.o. G2P0101 at [redacted]w[redacted]d being seen today for ongoing prenatal care.  She is currently monitored for the following issues for this low-risk pregnancy and has Allergic rhinitis; Anxiety disorder; Chronic UTI; Depression; Eczema; Intermittent asthma, uncomplicated; PCOS (polycystic ovarian syndrome); Interstitial cystitis; Supervision of high risk pregnancy, antepartum; History of preterm delivery, currently pregnant; and Obesity complicating pregnancy in third trimester on their problem list.  ----------------------------------------------------------------------------------- Patient reports diarrhea.   Contractions: Not present. Vag. Bleeding: None.  Movement: Present. Denies leaking of fluid.  ----------------------------------------------------------------------------------- The following portions of the patient's history were reviewed and updated as appropriate: allergies, current medications, past family history, past medical history, past social history, past surgical history and problem list. Problem list updated.   Objective  Blood pressure 110/68, weight 182 lb (82.6 kg), last menstrual period 07/20/2020, unknown if currently breastfeeding. Pregravid weight 162 lb (73.5 kg) Total Weight Gain 20 lb (9.072 kg) Urinalysis:      Fetal Status: Fetal Heart Rate (bpm): 145 Fundal Height: 32 cm Movement: Present  Presentation: Vertex  General:  Alert, oriented and cooperative. Patient is in no acute distress.  Skin: Skin is warm and dry. No rash noted.   Cardiovascular: Normal heart rate noted  Respiratory: Normal respiratory effort, no problems with respiration noted  Abdomen: Soft, gravid, appropriate for gestational age. Pain/Pressure: Absent     Pelvic:  Cervical exam performed Dilation: Closed Effacement (%): 0 Station: -3  Extremities: Normal range of motion.     ental Status: Normal mood and affect. Normal  behavior. Normal judgment and thought content.     Assessment   36 y.o. G2P0101 at [redacted]w[redacted]d by  04/26/2021, by Last Menstrual Period presenting for routine prenatal visit  Plan   SECOND Problems (from 09/17/20 to present)    Problem Noted Resolved   Supervision of high risk pregnancy, antepartum 09/17/2020 by [name], MD No   Overview Addendum 12/05/2020  8:53 AM by [name], MD    Clinic [facility] Prenatal Labs  Dating LMP = 10 week [place] Blood type: A/Positive/-- (10/11 1209)   Genetic Screen Inheritest negative SMA, CF, and Fragile-X     NIPS: Normal XX Antibody:Negative (10/11 1209)  Anatomic [place] Normal 12/05/2020 Rubella: 3.49 (10/11 1209) Varicella: Immune  GTT Early:               Third trimester:  RPR: Non Reactive (10/11 1209)   Rhogam N/A HBsAg: Negative (10/11 1209)   TDaP vaccine                       Flu Shot: COVID vaccinated  HIV: Non Reactive (10/11 1209)   Baby Food                                GBS:   Contraception  Pap: 09/17/2020 NILM HPV negative  CBB     CS/VBAC N/A   Support Person            Previous Version   History of preterm delivery, currently pregnant 09/17/2020 by [name], MD No   Intermittent asthma, uncomplicated 02/26/2015 by Shanda Bumps No   Preterm delivery, delivered 04/22/2017 by Conard Novak, MD 10/19/2020 by [name], MD       Gestational age appropriate obstetric precautions including but not limited to vaginal bleeding, contractions, leaking of fluid  and fetal movement were reviewed in detail with the patient.    Return in about 2 weeks (around 03/15/2021) for ROB.  [name], MD, Evern Core [facility], [facility] 03/01/2021, 12:04 PM

## 2021-03-03 ENCOUNTER — Other Ambulatory Visit: Payer: Self-pay | Admitting: Obstetrics and Gynecology

## 2021-03-04 NOTE — Telephone Encounter (Signed)
Please advise 

## 2021-03-06 ENCOUNTER — Other Ambulatory Visit: Payer: Self-pay

## 2021-03-13 ENCOUNTER — Ambulatory Visit (INDEPENDENT_AMBULATORY_CARE_PROVIDER_SITE_OTHER): Payer: BC Managed Care – PPO | Admitting: Advanced Practice Midwife

## 2021-03-13 ENCOUNTER — Other Ambulatory Visit: Payer: Self-pay

## 2021-03-13 VITALS — BP 116/76 | Ht 63.0 in | Wt 183.6 lb

## 2021-03-13 DIAGNOSIS — R300 Dysuria: Secondary | ICD-10-CM

## 2021-03-13 DIAGNOSIS — O09899 Supervision of other high risk pregnancies, unspecified trimester: Secondary | ICD-10-CM

## 2021-03-13 DIAGNOSIS — O0993 Supervision of high risk pregnancy, unspecified, third trimester: Secondary | ICD-10-CM

## 2021-03-13 DIAGNOSIS — O99213 Obesity complicating pregnancy, third trimester: Secondary | ICD-10-CM

## 2021-03-13 DIAGNOSIS — Z3A33 33 weeks gestation of pregnancy: Secondary | ICD-10-CM

## 2021-03-13 LAB — POCT URINALYSIS DIPSTICK
Glucose, UA: NEGATIVE
Protein, UA: NEGATIVE

## 2021-03-13 LAB — POCT URINALYSIS DIPSTICK OB
Bilirubin, UA: NEGATIVE
Blood, UA: POSITIVE
Glucose, UA: NEGATIVE
Ketones, UA: NEGATIVE
Nitrite, UA: NEGATIVE
POC,PROTEIN,UA: NEGATIVE
Spec Grav, UA: 1.025 (ref 1.010–1.025)
Urobilinogen, UA: NEGATIVE E.U./dL — AB
pH, UA: 6 (ref 5.0–8.0)

## 2021-03-13 NOTE — Progress Notes (Signed)
Routine Prenatal Care Visit  Subjective  Kaitlin [name] is a 35 y.o. G2P0101 at [redacted]w[redacted]d being seen today for ongoing prenatal care.  She is currently monitored for the following issues for this high-risk pregnancy and has Allergic rhinitis; Anxiety disorder; Chronic UTI; Depression; Eczema; Intermittent asthma, uncomplicated; PCOS (polycystic ovarian syndrome); Interstitial cystitis; Supervision of high risk pregnancy, antepartum; History of preterm delivery, currently pregnant; Obesity complicating pregnancy in third trimester; and Thyroid nodule on their problem list.  ----------------------------------------------------------------------------------- Patient reports concerns with history of preterm delivery and symptoms she is having. She has back pain and pelvic pressure. She denies burning or change in frequency. She does notice a different odor in the urine. She denies vaginal symptoms.   Contractions: Not present. Vag. Bleeding: None.  Movement: Present. Leaking Fluid denies.  ----------------------------------------------------------------------------------- The following portions of the patient's history were reviewed and updated as appropriate: allergies, current medications, past family history, past medical history, past social history, past surgical history and problem list. Problem list updated.  Objective  Blood pressure 116/76, height 5\' 3"  (1.6 m), weight 183 lb 9.6 oz (83.3 kg), last menstrual period 07/20/2020, unknown if currently breastfeeding. Pregravid weight 162 lb (73.5 kg) Total Weight Gain 21 lb 9.6 oz (9.798 kg) Urinalysis: Urine Protein Negative  Urine Glucose Negative  Fetal Status: Fetal Heart Rate (bpm): 154 Fundal Height: 34 cm Movement: Present     General:  Alert, oriented and cooperative. Patient is in no acute distress.  Skin: Skin is warm and dry. No rash noted.   Cardiovascular: Normal heart rate noted  Respiratory: Normal respiratory effort, no  problems with respiration noted  Abdomen: Soft, gravid, appropriate for gestational age. Pain/Pressure: Absent     Pelvic:  Cervical exam performed Dilation: Closed      Extremities: Normal range of motion.  Edema: None  Mental Status: Normal mood and affect. Normal behavior. Normal judgment and thought content.   Assessment   35 y.o. G2P0101 at [redacted]w[redacted]d by  04/26/2021, by Last Menstrual Period presenting for work-in prenatal visit  Plan   SECOND Problems (from 09/17/20 to present)    Problem Noted Resolved   Supervision of high risk pregnancy, antepartum 09/17/2020 by 11/17/2020, MD No   Overview Addendum 12/05/2020  8:53 AM by 12/07/2020, MD    Clinic [facility] Prenatal Labs  Dating LMP = 10 week Kaitlin [name] Blood type: A/Positive/-- (10/11 1209)   Genetic Screen Inheritest negative SMA, CF, and Fragile-X     NIPS: Normal XX Antibody:Negative (10/11 1209)  Anatomic 1210 Normal 12/05/2020 Rubella: 3.49 (10/11 1209) Varicella: Immune  GTT Early:               Third trimester:  RPR: Non Reactive (10/11 1209)   Rhogam N/A HBsAg: Negative (10/11 1209)   TDaP vaccine                       Flu Shot: COVID vaccinated  HIV: Non Reactive (10/11 1209)   Baby Food                                GBS:   Contraception  Pap: 09/17/2020 NILM HPV negative  CBB     CS/VBAC N/A   Support Person            Previous Version   History of preterm delivery, currently pregnant 09/17/2020 by 11/17/2020, MD No   Intermittent asthma, uncomplicated  02/26/2015 by Kaitlin [name] No   Preterm delivery, delivered 04/22/2017 by Kaitlin Novak, MD 10/19/2020 by Kaitlin Austria, MD    Urine culture sent (hx GBS bacteriuria with G1/preterm delivery)   Preterm labor symptoms and general obstetric precautions including but not limited to vaginal bleeding, contractions, leaking of fluid and fetal movement were reviewed in detail with the patient.   Return for scheduled prenatal appointment.  Kaitlin [name], CNM 03/13/2021 8:55 AM

## 2021-03-15 ENCOUNTER — Other Ambulatory Visit: Payer: Self-pay

## 2021-03-15 ENCOUNTER — Ambulatory Visit (INDEPENDENT_AMBULATORY_CARE_PROVIDER_SITE_OTHER): Payer: BC Managed Care – PPO | Admitting: Obstetrics and Gynecology

## 2021-03-15 VITALS — BP 110/68 | Wt 187.0 lb

## 2021-03-15 DIAGNOSIS — J4520 Mild intermittent asthma, uncomplicated: Secondary | ICD-10-CM

## 2021-03-15 DIAGNOSIS — O0990 Supervision of high risk pregnancy, unspecified, unspecified trimester: Secondary | ICD-10-CM

## 2021-03-15 DIAGNOSIS — Z3A34 34 weeks gestation of pregnancy: Secondary | ICD-10-CM

## 2021-03-15 DIAGNOSIS — O99213 Obesity complicating pregnancy, third trimester: Secondary | ICD-10-CM

## 2021-03-15 DIAGNOSIS — O09899 Supervision of other high risk pregnancies, unspecified trimester: Secondary | ICD-10-CM

## 2021-03-15 LAB — POCT URINALYSIS DIPSTICK OB
Glucose, UA: NEGATIVE
POC,PROTEIN,UA: NEGATIVE

## 2021-03-15 LAB — URINE CULTURE

## 2021-03-15 NOTE — Progress Notes (Signed)
Routine Prenatal Care Visit  Subjective  Kaitlin [name] is a 35 y.o. G2P0101 at [redacted]w[redacted]d being seen today for ongoing prenatal care.  She is currently monitored for the following issues for this high-risk pregnancy and has Allergic rhinitis; Anxiety disorder; Chronic UTI; Depression; Eczema; Intermittent asthma, uncomplicated; PCOS (polycystic ovarian syndrome); Interstitial cystitis; Supervision of high risk pregnancy, antepartum; History of preterm delivery, currently pregnant; Obesity complicating pregnancy in third trimester; and Thyroid nodule on their problem list.  ----------------------------------------------------------------------------------- Patient reports no complaints.   Contractions: Not present. Vag. Bleeding: None.  Movement: Present. Denies leaking of fluid.  ----------------------------------------------------------------------------------- The following portions of the patient's history were reviewed and updated as appropriate: allergies, current medications, past family history, past medical history, past social history, past surgical history and problem list. Problem list updated.   Objective  Blood pressure 110/68, weight 187 lb (84.8 kg), last menstrual period 07/20/2020, unknown if currently breastfeeding. Pregravid weight 162 lb (73.5 kg) Total Weight Gain 25 lb (11.3 kg) Urinalysis:      Fetal Status: Fetal Heart Rate (bpm): 150 Fundal Height: 35 cm Movement: Present  Presentation: Vertex  General:  Alert, oriented and cooperative. Patient is in no acute distress.  Skin: Skin is warm and dry. No rash noted.   Cardiovascular: Normal heart rate noted  Respiratory: Normal respiratory effort, no problems with respiration noted  Abdomen: Soft, gravid, appropriate for gestational age. Pain/Pressure: Absent     Pelvic:  Cervical exam deferred        Extremities: Normal range of motion.     ental Status: Normal mood and affect. Normal behavior. Normal judgment and  thought content.     Assessment   35 y.o. G2P0101 at [redacted]w[redacted]d by  04/26/2021, by Last Menstrual Period presenting for routine prenatal visit  Plan   SECOND Problems (from 09/17/20 to present)    Problem Noted Resolved   Supervision of high risk pregnancy, antepartum 09/17/2020 by [name], MD No   Overview Addendum 12/05/2020  8:53 AM by [name], MD    [facility]inic [facility] Prenatal Labs  Dating LMP = 10 week [place] Blood type: A/Positive/-- (10/11 1209)   Genetic Screen Inheritest negative SMA, CF, and Fragile-X     NIPS: Normal XX Antibody:Negative (10/11 1209)  Anatomic [place] Normal 12/05/2020 Rubella: 3.49 (10/11 1209) Varicella: Immune  GTT Early:               Third trimester:  RPR: Non Reactive (10/11 1209)   Rhogam N/A HBsAg: Negative (10/11 1209)   TDaP vaccine                       Flu Shot: COVID vaccinated  HIV: Non Reactive (10/11 1209)   Baby Food                                GBS:   Contraception  Pap: 09/17/2020 NILM HPV negative  CBB     CS/VBAC N/A   Support Person            Previous Version   History of preterm delivery, currently pregnant 09/17/2020 by [name], MD No   Intermittent asthma, uncomplicated 02/26/2015 by Shanda Bumps No   Preterm delivery, delivered 04/22/2017 by Conard Novak, MD 10/19/2020 by [name], MD       Gestational age appropriate obstetric precautions including but not limited to vaginal bleeding, contractions, leaking  of fluid and fetal movement were reviewed in detail with the patient.    Return in about 1 week (around 03/22/2021) for ROB.  [name], MD, [facility], [facility] 03/15/2021, 8:38 AM

## 2021-03-21 ENCOUNTER — Encounter: Payer: Self-pay | Admitting: Obstetrics and Gynecology

## 2021-03-21 ENCOUNTER — Ambulatory Visit (INDEPENDENT_AMBULATORY_CARE_PROVIDER_SITE_OTHER): Payer: BC Managed Care – PPO | Admitting: Obstetrics and Gynecology

## 2021-03-21 ENCOUNTER — Other Ambulatory Visit: Payer: Self-pay

## 2021-03-21 VITALS — BP 124/74 | Wt 182.0 lb

## 2021-03-21 DIAGNOSIS — Z3A34 34 weeks gestation of pregnancy: Secondary | ICD-10-CM

## 2021-03-21 DIAGNOSIS — O0993 Supervision of high risk pregnancy, unspecified, third trimester: Secondary | ICD-10-CM

## 2021-03-21 DIAGNOSIS — O99213 Obesity complicating pregnancy, third trimester: Secondary | ICD-10-CM

## 2021-03-21 DIAGNOSIS — O09899 Supervision of other high risk pregnancies, unspecified trimester: Secondary | ICD-10-CM

## 2021-03-21 NOTE — Progress Notes (Signed)
Routine Prenatal Care Visit  Subjective  Kaitlin [name] is a 35 y.o. G2P0101 at [redacted]w[redacted]d being seen today for ongoing prenatal care.  She is currently monitored for the following issues for this high-risk pregnancy and has Allergic rhinitis; Anxiety disorder; Chronic UTI; Depression; Eczema; Intermittent asthma, uncomplicated; PCOS (polycystic ovarian syndrome); Interstitial cystitis; Supervision of high risk pregnancy, antepartum; History of preterm delivery, currently pregnant; Obesity complicating pregnancy in third trimester; and Thyroid nodule on their problem list.  ----------------------------------------------------------------------------------- Patient reports BP slightly above what we're seeing in clinic, but in normal range. Denies HA, visual changes, and RUQ pain. .   Contractions: Not present. Vag. Bleeding: None.  Movement: Present. Leaking Fluid denies.  ----------------------------------------------------------------------------------- The following portions of the patient's history were reviewed and updated as appropriate: allergies, current medications, past family history, past medical history, past social history, past surgical history and problem list. Problem list updated.  Objective  Blood pressure 124/74, weight 182 lb (82.6 kg), last menstrual period 07/20/2020, unknown if currently breastfeeding. Pregravid weight 162 lb (73.5 kg) Total Weight Gain 20 lb (9.072 kg) Urinalysis: Urine Protein    Urine Glucose    Fetal Status: Fetal Heart Rate (bpm): 160 Fundal Height: 35 cm Movement: Present  Presentation: Homero Fellers Breech by bedside U/S.   General:  Alert, oriented and cooperative. Patient is in no acute distress.  Skin: Skin is warm and dry. No rash noted.   Cardiovascular: Normal heart rate noted  Respiratory: Normal respiratory effort, no problems with respiration noted  Abdomen: Soft, gravid, appropriate for gestational age. Pain/Pressure: Absent     Pelvic:   Cervical exam deferred        Extremities: Normal range of motion.  Edema: None  Mental Status: Normal mood and affect. Normal behavior. Normal judgment and thought content.   Assessment   35 y.o. G2P0101 at [redacted]w[redacted]d by  04/26/2021, by Last Menstrual Period presenting for routine prenatal visit  Plan   SECOND Problems (from 09/17/20 to present)    Problem Noted Resolved   Supervision of high risk pregnancy, antepartum 09/17/2020 by [name], MD No   Overview Addendum 03/21/2021 11:33 AM by Conard Novak, MD    [facility]inic [facility] Prenatal Labs  Dating LMP = 10 week [place] Blood type: A/Positive/-- (10/11 1209)   Genetic Screen Inheritest negative SMA, CF, and Fragile-X     NIPS: Normal XX Antibody:Negative (10/11 1209)  Anatomic [place] Normal 12/05/2020 Rubella: 3.49 (10/11 1209) Varicella: Immune  GTT Early:               Third trimester:  RPR: Non Reactive (10/11 1209)   Rhogam N/A HBsAg: Negative (10/11 1209)   TDaP vaccine 02/01/21                       Flu Shot: 09/17/20 COVID vaccinated  HIV: Non Reactive (10/11 1209)   Baby Food                                GBS:   Contraception  Pap: 09/17/2020 NILM HPV negative  CBB     CS/VBAC N/A   Support Person           Previous Version   History of preterm delivery, currently pregnant 09/17/2020 by [name], MD No   Intermittent asthma, uncomplicated 02/26/2015 by Shanda Bumps No   Preterm delivery, delivered 04/22/2017 by Conard Novak, MD 10/19/2020 by  [name], MD       Preterm labor symptoms and general obstetric precautions including but not limited to vaginal bleeding, contractions, leaking of fluid and fetal movement were reviewed in detail with the patient. Please refer to After Visit Summary for other counseling recommendations.   Homero Fellers breech on u/s today. Discussed management options. ECV between 36-37 weeks (given her h/o PTD).  She is leaning toward C-section, if no fetal presentation change.    - recommend GBS next visit with her history of PTD.   - recommended she notify L&D providers/RNs if she presents to L&D for labor or any other concern.   Return in about 2 weeks (around 04/04/2021) for Routine Prenatal Appointment.   Thomasene Mohair, MD, [name], [facility] 03/21/2021 12:04 PM

## 2021-03-23 ENCOUNTER — Other Ambulatory Visit: Payer: Self-pay

## 2021-03-23 ENCOUNTER — Observation Stay
Admission: EM | Admit: 2021-03-23 | Discharge: 2021-03-23 | Disposition: A | Discharge status: Home / Self Care | Payer: BC Managed Care – PPO | Attending: Obstetrics and Gynecology | Admitting: Obstetrics and Gynecology

## 2021-03-23 ENCOUNTER — Encounter: Payer: Self-pay | Admitting: Obstetrics and Gynecology

## 2021-03-23 DIAGNOSIS — Z87891 Personal history of nicotine dependence: Secondary | ICD-10-CM | POA: Diagnosis not present

## 2021-03-23 DIAGNOSIS — Z91040 Latex allergy status: Secondary | ICD-10-CM | POA: Insufficient documentation

## 2021-03-23 DIAGNOSIS — J45909 Unspecified asthma, uncomplicated: Secondary | ICD-10-CM | POA: Diagnosis not present

## 2021-03-23 DIAGNOSIS — J4520 Mild intermittent asthma, uncomplicated: Secondary | ICD-10-CM

## 2021-03-23 DIAGNOSIS — O26893 Other specified pregnancy related conditions, third trimester: Secondary | ICD-10-CM | POA: Diagnosis not present

## 2021-03-23 DIAGNOSIS — O99891 Other specified diseases and conditions complicating pregnancy: Secondary | ICD-10-CM | POA: Diagnosis not present

## 2021-03-23 DIAGNOSIS — O99513 Diseases of the respiratory system complicating pregnancy, third trimester: Secondary | ICD-10-CM | POA: Insufficient documentation

## 2021-03-23 DIAGNOSIS — O0990 Supervision of high risk pregnancy, unspecified, unspecified trimester: Secondary | ICD-10-CM

## 2021-03-23 DIAGNOSIS — R1030 Lower abdominal pain, unspecified: Secondary | ICD-10-CM | POA: Insufficient documentation

## 2021-03-23 DIAGNOSIS — O09899 Supervision of other high risk pregnancies, unspecified trimester: Secondary | ICD-10-CM

## 2021-03-23 DIAGNOSIS — Z3490 Encounter for supervision of normal pregnancy, unspecified, unspecified trimester: Secondary | ICD-10-CM

## 2021-03-23 DIAGNOSIS — R109 Unspecified abdominal pain: Secondary | ICD-10-CM

## 2021-03-23 DIAGNOSIS — Z3A35 35 weeks gestation of pregnancy: Secondary | ICD-10-CM | POA: Insufficient documentation

## 2021-03-23 NOTE — OB Triage Note (Signed)
Pt is a G2P0101 and [redacted]w[redacted]d presenting to L&D with c/o her abdomen "staying rock hard" for the last 45 minutes. Pt states she took a nap and woke up at 1815 and her abdomen was hard. Pt states she last felt her baby move within the last hour and states it has not been as frequent as usual. Pt rates pain 0/10 on a 0-10 pain scale. Pt has anterior placenta. Pt denies VB or LOF. VSS. Monitors applied and assessing.

## 2021-03-23 NOTE — OB Triage Note (Signed)
Discharge instructions reviewed and preterm labor red flag precautions reviewed. Pt verified understanding and is in agreement with plan of care. Pt discharged home with husband.

## 2021-03-24 DIAGNOSIS — O26893 Other specified pregnancy related conditions, third trimester: Secondary | ICD-10-CM

## 2021-03-24 NOTE — Discharge Summary (Addendum)
Please see Final progress note.  Mirna Mires, CNM  03/24/2021 3:40 AM

## 2021-03-24 NOTE — Discharge Summary (Signed)
Final Progress Note  Patient ID: Kaitlin [name] MRN: [id number] DOB/AGE: 1986-02-14 35 y.o.  Admit date: 03/23/2021 Admitting provider: Natale Milch, MD Discharge date: 03/24/2021   Admission Diagnoses: Pt is a 35 year old Gravida 2 para 1, who presents with a complaint of lower abdominal pain. She is 35 weeks 2 days gestation. She denies any regular contractions, but has felt increased pelvic pressure this evening and shares that her baby was breech at her last prenatal appointment and wonders whether she might be trying to turn vertex. She denies any LOF or vaginal bleeding. She shares that she has anxiety and that she is feeling somewhat anxious. She denies any headache, visual changes or chest pain.  Discharge Diagnoses:  Active Problems:   Pregnancy    History of Present Illness: The patient is a 35 y.o. female G2P0101 at [redacted]w[redacted]d who presents for evaluation of pelvic pressure over the last few hours..   Past Medical History:  Diagnosis Date  . Anxiety and depression   . Asthma   . Eczema   . Interstitial cystitis   . PCOS (polycystic ovarian syndrome)   . Premature labor before 37 weeks of gestation with delivery, delivered    PPROM at 34 weeks    Past Surgical History:  Procedure Laterality Date  . Excision of lymph node  02/24/2013   sublingual lymph node: Dr Gertie Baron  . INTRAUTERINE DEVICE INSERTION  06/01/2017   [facility]  . WISDOM TOOTH EXTRACTION      No current facility-administered medications on file prior to encounter.   Current Outpatient Medications on File Prior to Encounter  Medication Sig Dispense Refill  . Cholecalciferol (VITAMIN D3) 2000 units capsule Take by mouth.    . Cranber-Chokeber-Pumpk-Grpefrt (CRANBERRY/CHOKEBERRY) TABS     . cyclobenzaprine (FLEXERIL) 10 MG tablet TAKE 1 TABLET BY MOUTH THREE TIMES A DAY AS NEEDED FOR MUSCLE SPASMS 30 tablet 0  . prenatal vitamin w/FE, FA (PRENATAL 1 + 1) 27-1 MG TABS tablet Take 1 tablet by  mouth daily at 12 noon.    . sertraline (ZOLOFT) 50 MG tablet TAKE 1 TABLET BY MOUTH EVERY DAY 90 tablet 1  . albuterol (VENTOLIN HFA) 108 (90 Base) MCG/ACT inhaler Inhale 2 puffs into the lungs every 4 (four) hours as needed for wheezing or shortness of breath. (Patient not taking: Reported on 03/23/2021) 1 Inhaler 3  . cetirizine (ZYRTEC) 10 MG tablet Take by mouth.  (Patient not taking: Reported on 03/23/2021)    . Doxylamine-Pyridoxine (DICLEGIS) 10-10 MG TBEC Take 2 tablets by mouth at bedtime. If symptoms persist, add one tablet in the morning and one in the afternoon (Patient not taking: No sig reported) 100 tablet 5  . fluticasone (FLONASE) 50 MCG/ACT nasal spray Place 2 sprays into both nostrils daily. (Patient not taking: Reported on 03/23/2021) 48 g 0    Allergies  Allergen Reactions  . Chocolate Flavor Rash  . Sulfa Antibiotics Shortness Of Breath  . Sulfamethoxazole-Trimethoprim Shortness Of Breath  . Latex Hives  . Metronidazole Hives    Social History   Socioeconomic History  . Marital status: Married    Spouse name: Not on file  . Number of children: 1  . Years of education: Not on file  . Highest education level: Not on file  Occupational History  . Not on file  Tobacco Use  . Smoking status: Former Games developer  . Smokeless tobacco: Never Used  Vaping Use  . Vaping Use: Never used  Substance and  Sexual Activity  . Alcohol use: No  . Drug use: No  . Sexual activity: Yes    Partners: Male    Birth control/protection: I.U.D.  Other Topics Concern  . Not on file  Social History Narrative  . Not on file   Social Determinants of Health   Financial Resource Strain: Not on file  Food Insecurity: Not on file  Transportation Needs: Not on file  Physical Activity: Not on file  Stress: Not on file  Social Connections: Not on file  Intimate Partner Violence: Not on file    Family History  Problem Relation Age of Onset  . Multiple sclerosis Mother   . Breast cancer  Maternal Grandmother        40s  . Hypertension Maternal Grandfather   . Thyroid cancer Maternal Grandfather   . Lung cancer Paternal Grandmother   . Hypertension Paternal Grandmother   . Diabetes Mellitus II Paternal Grandfather   . Hypertension Paternal Grandfather      Review of Systems  Constitutional: Negative.   HENT: Negative.   Eyes: Negative.   Respiratory: Negative.   Cardiovascular: Negative.   Gastrointestinal: Positive for abdominal pain.       Describes this as pressure, not pain  Genitourinary: Negative.   Musculoskeletal: Negative.   Skin: Negative.   Neurological: Negative.   Endo/Heme/Allergies: Negative.   Psychiatric/Behavioral: The patient is nervous/anxious.      Physical Exam: BP 129/81   Pulse 85   Temp 98.1 F (36.7 C) (Oral)   Resp 18   Ht 5\' 3"  (1.6 m)   Wt 85.3 kg   LMP 07/20/2020   BMI 33.30 kg/m   OBGyn Exam - SVE per RN: 1cm/50% effaced. Contractions palpated by the RN- mild, irregular Pain level is 0 out of 10 per the pateint.  Consults: None  Significant Findings/ Diagnostic Studies: NA  Procedures: EFM NST Baseline FHR: 140 beats/min Variability: moderate Accelerations: present Decelerations: absent Tocometry: mild , irregular contractions _patient denies feeling these  Interpretation:  INDICATIONS: lower abdominal pressure RESULTS:  A NST procedure was performed with FHR monitoring and a normal baseline established, appropriate time of 20-40 minutes of evaluation, and accels >2 seen w 15x15 characteristics.  Results show a REACTIVE NST.    Hospital Course: The patient was admitted to Labor and Delivery Triage for observation. She was monitored  for labor and her cervix was evaluated by the triage RN. As she did not change her cervix, and the FHTs were reactive, she is discharged home. She is advised to hydrate well, and try tub bath to relax.  She has an OB appointment in a few days at [facility] for follow up.  Discharge  Condition: good  Disposition: Discharge disposition: 01-Home or Self Care       Diet: Regular diet  Discharge Activity: Activity as tolerated, with pelvic rest until [redacted] weeks gestation.   Allergies as of 03/23/2021      Reactions   Chocolate Flavor Rash   Sulfa Antibiotics Shortness Of Breath   Sulfamethoxazole-trimethoprim Shortness Of Breath   Latex Hives   Metronidazole Hives      Medication List    ASK your doctor about these medications   albuterol 108 (90 Base) MCG/ACT inhaler Commonly known as: Ventolin HFA Inhale 2 puffs into the lungs every 4 (four) hours as needed for wheezing or shortness of breath.   cetirizine 10 MG tablet Commonly known as: ZYRTEC Take by mouth.   Cranberry/Chokeberry Tabs  cyclobenzaprine 10 MG tablet Commonly known as: FLEXERIL TAKE 1 TABLET BY MOUTH THREE TIMES A DAY AS NEEDED FOR MUSCLE SPASMS   Doxylamine-Pyridoxine 10-10 MG Tbec Commonly known as: Diclegis Take 2 tablets by mouth at bedtime. If symptoms persist, add one tablet in the morning and one in the afternoon   fluticasone 50 MCG/ACT nasal spray Commonly known as: FLONASE Place 2 sprays into both nostrils daily.   prenatal vitamin w/FE, FA 27-1 MG Tabs tablet Take 1 tablet by mouth daily at 12 noon.   sertraline 50 MG tablet Commonly known as: ZOLOFT TAKE 1 TABLET BY MOUTH EVERY DAY   Vitamin D3 50 MCG (2000 UT) capsule Take by mouth.          Signed: Mirna Mires, CNM  03/24/2021, 3:44 AM

## 2021-03-26 ENCOUNTER — Encounter: Payer: Self-pay | Admitting: Obstetrics and Gynecology

## 2021-03-26 ENCOUNTER — Ambulatory Visit (INDEPENDENT_AMBULATORY_CARE_PROVIDER_SITE_OTHER): Payer: BC Managed Care – PPO | Admitting: Obstetrics and Gynecology

## 2021-03-26 ENCOUNTER — Other Ambulatory Visit: Payer: Self-pay

## 2021-03-26 VITALS — BP 120/70 | Ht 63.0 in | Wt 191.4 lb

## 2021-03-26 DIAGNOSIS — Z3A35 35 weeks gestation of pregnancy: Secondary | ICD-10-CM

## 2021-03-26 NOTE — Progress Notes (Signed)
Routine Prenatal Care Visit  Subjective  Kaitlin [name] is a 35 y.o. G2P0101 at [redacted]w[redacted]d being seen today for ongoing prenatal care.  She is currently monitored for the following issues for this low-risk pregnancy and has Allergic rhinitis; Anxiety disorder; Chronic UTI; Depression; Eczema; Intermittent asthma, uncomplicated; PCOS (polycystic ovarian syndrome); Interstitial cystitis; Supervision of high risk pregnancy, antepartum; History of preterm delivery, currently pregnant; Obesity complicating pregnancy in third trimester; Thyroid nodule; Pregnancy; and Abdominal pain in pregnancy, third trimester on their problem list.  ----------------------------------------------------------------------------------- Patient reports concern regarding decreased fetal movements. She also wonders if the baby is still breech. Contractions: Irritability. Vag. Bleeding: None.  Movement: Present. Denies leaking of fluid.  ----------------------------------------------------------------------------------- The following portions of the patient's history were reviewed and updated as appropriate: allergies, current medications, past family history, past medical history, past social history, past surgical history and problem list. Problem list updated.   Objective  Blood pressure 120/70, height 5\' 3"  (1.6 m), weight 191 lb 6.4 oz (86.8 kg), last menstrual period 07/20/2020, unknown if currently breastfeeding. Pregravid weight 162 lb (73.5 kg) Total Weight Gain 29 lb 6.4 oz (13.3 kg) Urinalysis:      Fetal Status: Fetal Heart Rate (bpm): 150 Fundal Height: 35 cm Movement: Present  Presentation: 07/22/2020 Breech  General:  Alert, oriented and cooperative. Patient is in no acute distress.  Skin: Skin is warm and dry. No rash noted.   Cardiovascular: Normal heart rate noted  Respiratory: Normal respiratory effort, no problems with respiration noted  Abdomen: Soft, gravid, appropriate for gestational age.  Pain/Pressure: Present     Pelvic:  Cervical exam deferred        Extremities: Normal range of motion.  Edema: None  Mental Status: Normal mood and affect. Normal behavior. Normal judgment and thought content.     Assessment   35 y.o. G2P0101 at [redacted]w[redacted]d by  04/26/2021, by Last Menstrual Period presenting for routine prenatal visit  Plan   SECOND Problems (from 09/17/20 to present)    Problem Noted Resolved   Supervision of high risk pregnancy, antepartum 09/17/2020 by 11/17/2020, MD No   Overview Addendum 03/21/2021 11:33 AM by 03/23/2021, MD    Clinic [facility] Prenatal Labs  Dating LMP = 10 week Conard Novak Blood type: A/Positive/-- (10/11 1209)   Genetic Screen Inheritest negative SMA, CF, and Fragile-X     NIPS: Normal XX Antibody:Negative (10/11 1209)  Anatomic 1210 Normal 12/05/2020 Rubella: 3.49 (10/11 1209) Varicella: Immune  GTT Early:               Third trimester:  RPR: Non Reactive (10/11 1209)   Rhogam N/A HBsAg: Negative (10/11 1209)   TDaP vaccine 02/01/21                       Flu Shot: 09/17/20 COVID vaccinated  HIV: Non Reactive (10/11 1209)   Baby Food                                GBS:   Contraception  Pap: 09/17/2020 NILM HPV negative  CBB     CS/VBAC N/A   Support Person            Previous Version   History of preterm delivery, currently pregnant 09/17/2020 by 11/17/2020, MD No   Intermittent asthma, uncomplicated 02/26/2015 by 02/28/2015 W No   Preterm delivery, delivered 04/22/2017 by 04/24/2017,  Mila Homer, MD 10/19/2020 by [name], MD      NST: 150 bpm baseline, moderate variability, 15x15 accelerations, no decelerations. Reactive  Bedside US, baby breech. Head maternal right.  AFI: 7.5 cm Fetal breathing and movement observed  Gestational age appropriate obstetric precautions including but not limited to vaginal bleeding, contractions, leaking of fluid and fetal movement were reviewed in detail with the patient.    Return in  about 1 week (around 04/02/2021) for ROB in person.  Natale Milch MD [facility], [facility] 03/26/2021, 11:17 AM

## 2021-03-27 ENCOUNTER — Encounter: Payer: BC Managed Care – PPO | Admitting: Obstetrics and Gynecology

## 2021-04-02 ENCOUNTER — Telehealth: Payer: Self-pay

## 2021-04-02 ENCOUNTER — Other Ambulatory Visit (HOSPITAL_COMMUNITY)
Admission: RE | Admit: 2021-04-02 | Discharge: 2021-04-02 | Disposition: A | Discharge status: Home / Self Care | Payer: BC Managed Care – PPO | Source: Ambulatory Visit | Attending: Obstetrics and Gynecology | Admitting: Obstetrics and Gynecology

## 2021-04-02 ENCOUNTER — Other Ambulatory Visit: Payer: Self-pay

## 2021-04-02 ENCOUNTER — Encounter: Payer: Self-pay | Admitting: Obstetrics and Gynecology

## 2021-04-02 ENCOUNTER — Ambulatory Visit (INDEPENDENT_AMBULATORY_CARE_PROVIDER_SITE_OTHER): Payer: BC Managed Care – PPO | Admitting: Obstetrics and Gynecology

## 2021-04-02 VITALS — BP 126/74 | Wt 191.0 lb

## 2021-04-02 DIAGNOSIS — O09899 Supervision of other high risk pregnancies, unspecified trimester: Secondary | ICD-10-CM

## 2021-04-02 DIAGNOSIS — Z3A36 36 weeks gestation of pregnancy: Secondary | ICD-10-CM

## 2021-04-02 DIAGNOSIS — O0993 Supervision of high risk pregnancy, unspecified, third trimester: Secondary | ICD-10-CM | POA: Insufficient documentation

## 2021-04-02 DIAGNOSIS — O329XX0 Maternal care for malpresentation of fetus, unspecified, not applicable or unspecified: Secondary | ICD-10-CM | POA: Insufficient documentation

## 2021-04-02 DIAGNOSIS — O99213 Obesity complicating pregnancy, third trimester: Secondary | ICD-10-CM

## 2021-04-02 LAB — OB RESULTS CONSOLE GC/CHLAMYDIA: Gonorrhea: NEGATIVE

## 2021-04-02 NOTE — Telephone Encounter (Signed)
Called and LVM for patient to advise of scheduled cesarean section w Britt Boozer 5/16 @ 11:30 - RNFA has been requested in the event CNM Jae Dire) isn't available  H&P 5/10 @ 9:50   Covid testing 5/13 @ [facility], [address]. Advised to mask until DOS.  Pre-admit phone call appointment to be requested - date and time will be included on H&P paper work. Also all appointments will be updated on pt MyChart. Explained that this appointment has a call window. Based on the time scheduled will indicate if the call will be received within a 4 hour window before 1:00 or after.  Advised that pt may also receive calls from the hospital pharmacy and pre-service center.  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 No secondary insurance.

## 2021-04-02 NOTE — Progress Notes (Signed)
Routine Prenatal Care Visit  Subjective  Kaitlin [name] is a 35 y.o. G2P0101 at [redacted]w[redacted]d being seen today for ongoing prenatal care.  She is currently monitored for the following issues for this high-risk pregnancy and has Allergic rhinitis; Anxiety disorder; Chronic UTI; Depression; Eczema; Intermittent asthma, uncomplicated; PCOS (polycystic ovarian syndrome); Interstitial cystitis; Supervision of high risk pregnancy, antepartum; History of preterm delivery, currently pregnant; Obesity complicating pregnancy in third trimester; Thyroid nodule; Pregnancy; Abdominal pain in pregnancy, third trimester; and Malpresentation of fetus, antepartum on their problem list.  ----------------------------------------------------------------------------------- Patient reports no complaints.   Contractions: Irregular. Vag. Bleeding: None.  Movement: Present. Leaking Fluid denies.  ----------------------------------------------------------------------------------- The following portions of the patient's history were reviewed and updated as appropriate: allergies, current medications, past family history, past medical history, past social history, past surgical history and problem list. Problem list updated.  Objective  Blood pressure 126/74, weight 191 lb (86.6 kg), last menstrual period 07/20/2020, unknown if currently breastfeeding. Pregravid weight 162 lb (73.5 kg) Total Weight Gain 29 lb (13.2 kg) Urinalysis: Urine Protein    Urine Glucose    Fetal Status: Fetal Heart Rate (bpm): 150   Movement: Present  Presentation: Homero Fellers Breech  General:  Alert, oriented and cooperative. Patient is in no acute distress.  Skin: Skin is warm and dry. No rash noted.   Cardiovascular: Normal heart rate noted  Respiratory: Normal respiratory effort, no problems with respiration noted  Abdomen: Soft, gravid, appropriate for gestational age. Pain/Pressure: Present     Pelvic:  Cervical exam performed Dilation: 1  Effacement (%): 40 Station: -3  Extremities: Normal range of motion.  Edema: None  Mental Status: Normal mood and affect. Normal behavior. Normal judgment and thought content.   Assessment   34 y.o. G2P0101 at [redacted]w[redacted]d by  04/26/2021, by Last Menstrual Period presenting for routine prenatal visit  Plan   SECOND Problems (from 09/17/20 to present)    Problem Noted Resolved   Malpresentation of fetus, antepartum 04/02/2021 by Conard Novak, MD No   Supervision of high risk pregnancy, antepartum 09/17/2020 by [name], MD No   Overview Addendum 03/21/2021 11:33 AM by Conard Novak, MD    Clinic [facility] Prenatal Labs  Dating LMP = 10 week [place] Blood type: A/Positive/-- (10/11 1209)   Genetic Screen Inheritest negative SMA, CF, and Fragile-X     NIPS: Normal XX Antibody:Negative (10/11 1209)  Anatomic [place] Normal 12/05/2020 Rubella: 3.49 (10/11 1209) Varicella: Immune  GTT Early:               Third trimester:  RPR: Non Reactive (10/11 1209)   Rhogam N/A HBsAg: Negative (10/11 1209)   TDaP vaccine 02/01/21                       Flu Shot: 09/17/20 COVID vaccinated  HIV: Non Reactive (10/11 1209)   Baby Food                                GBS:   Contraception  Pap: 09/17/2020 NILM HPV negative  CBB     CS/VBAC N/A   Support Person            Previous Version   History of preterm delivery, currently pregnant 09/17/2020 by [name], MD No   Intermittent asthma, uncomplicated 02/26/2015 by Shanda Bumps No   Preterm delivery, delivered 04/22/2017 by Conard Novak, MD 10/19/2020  by [name], MD       Preterm labor symptoms and general obstetric precautions including but not limited to vaginal bleeding, contractions, leaking of fluid and fetal movement were reviewed in detail with the patient. Please refer to After Visit Summary for other counseling recommendations.   Bedside u/s performed confirming frank breech, DVP 3.5 cm.   C-section scheduled for  5/16 at 1130a. Will verify the breech prior to starting surgery.  GBS/aptima today  Return in about 1 week (around 04/09/2021) for Routine Prenatal Appointment.   Thomasene Mohair, MD, [name], [facility] 04/02/2021 8:33 AM

## 2021-04-02 NOTE — Telephone Encounter (Signed)
-----   Message from Conard Novak, MD sent at 04/02/2021  8:36 AM EDT ----- Regarding: Schedule surgery See special requests below.  I might need an RNFA, if the CNM is unavailable to help.   Surgery Booking Request Patient Full Name:  Kaitlin [name]  MRN: [id number]  DOB: 12-Mar-1986  Surgeon: Thomasene Mohair, MD  Requested Surgery Date and Time: 04/22/2021 @ 1130 AM Primary Diagnosis AND Code: malpresentation of fetus Secondary Diagnosis and Code:  Surgical Procedure: Cesarean Section RNFA Requested?: YES L&D Notification: Yes Admission Status: surgery admit Length of Surgery: 75 min Special Case Needs: No H&P: Yes Phone Interview???:  Yes Interpreter: No Medical Clearance:  No Special Scheduling Instructions: needs to be at 1130 AM. ADJUST MY SCHEDULE IN CLINIC FOR THIS. Any known health/anesthesia issues, diabetes, sleep apnea, latex allergy, defibrillator/pacemaker?: No Acuity: P2   (P1 highest, P2 delay may cause harm, P3 low, elective gyn, P4 lowest)

## 2021-04-04 ENCOUNTER — Encounter: Payer: BC Managed Care – PPO | Admitting: Obstetrics and Gynecology

## 2021-04-04 LAB — STREP GP B NAA: Strep Gp B NAA: NEGATIVE

## 2021-04-04 LAB — CERVICOVAGINAL ANCILLARY ONLY
Chlamydia: NEGATIVE
Comment: NEGATIVE
Comment: NORMAL
Neisseria Gonorrhea: NEGATIVE

## 2021-04-08 ENCOUNTER — Encounter: Payer: Self-pay | Admitting: Obstetrics and Gynecology

## 2021-04-08 ENCOUNTER — Ambulatory Visit (INDEPENDENT_AMBULATORY_CARE_PROVIDER_SITE_OTHER): Payer: BC Managed Care – PPO | Admitting: Obstetrics and Gynecology

## 2021-04-08 ENCOUNTER — Other Ambulatory Visit: Payer: Self-pay

## 2021-04-08 VITALS — BP 120/74 | Wt 195.0 lb

## 2021-04-08 DIAGNOSIS — O0993 Supervision of high risk pregnancy, unspecified, third trimester: Secondary | ICD-10-CM

## 2021-04-08 DIAGNOSIS — Z3A37 37 weeks gestation of pregnancy: Secondary | ICD-10-CM

## 2021-04-08 DIAGNOSIS — O329XX0 Maternal care for malpresentation of fetus, unspecified, not applicable or unspecified: Secondary | ICD-10-CM

## 2021-04-08 DIAGNOSIS — O99213 Obesity complicating pregnancy, third trimester: Secondary | ICD-10-CM

## 2021-04-08 DIAGNOSIS — O09899 Supervision of other high risk pregnancies, unspecified trimester: Secondary | ICD-10-CM

## 2021-04-08 NOTE — Progress Notes (Signed)
Routine Prenatal Care Visit  Subjective  Kaitlin [name] is a 35 y.o. G2P0101 at [redacted]w[redacted]d being seen today for ongoing prenatal care.  She is currently monitored for the following issues for this high-risk pregnancy and has Allergic rhinitis; Anxiety disorder; Chronic UTI; Depression; Eczema; Intermittent asthma, uncomplicated; PCOS (polycystic ovarian syndrome); Interstitial cystitis; Supervision of high risk pregnancy, antepartum; History of preterm delivery, currently pregnant; Obesity complicating pregnancy in third trimester; Thyroid nodule; Pregnancy; Abdominal pain in pregnancy, third trimester; and Malpresentation of fetus, antepartum on their problem list.  ----------------------------------------------------------------------------------- Patient reports no complaints.   Contractions: Irritability. Vag. Bleeding: None.  Movement: Present. Leaking Fluid denies.  ----------------------------------------------------------------------------------- The following portions of the patient's history were reviewed and updated as appropriate: allergies, current medications, past family history, past medical history, past social history, past surgical history and problem list. Problem list updated.  Objective  Blood pressure 120/74, weight 195 lb (88.5 kg), last menstrual period 07/20/2020, unknown if currently breastfeeding. Pregravid weight 162 lb (73.5 kg) Total Weight Gain 33 lb (15 kg) Urinalysis: Urine Protein    Urine Glucose    Fetal Status: Fetal Heart Rate (bpm): 145   Movement: Present  Presentation: Homero Fellers Breech  General:  Alert, oriented and cooperative. Patient is in no acute distress.  Skin: Skin is warm and dry. No rash noted.   Cardiovascular: Normal heart rate noted  Respiratory: Normal respiratory effort, no problems with respiration noted  Abdomen: Soft, gravid, appropriate for gestational age. Pain/Pressure: Absent     Pelvic:  Cervical exam deferred        Extremities:  Normal range of motion.  Edema: None  Mental Status: Normal mood and affect. Normal behavior. Normal judgment and thought content.   Assessment   35 y.o. G2P0101 at [redacted]w[redacted]d by  04/26/2021, by Last Menstrual Period presenting for routine prenatal visit  Plan   SECOND Problems (from 09/17/20 to present)    Problem Noted Resolved   Malpresentation of fetus, antepartum 04/02/2021 by Conard Novak, MD No   Supervision of high risk pregnancy, antepartum 09/17/2020 by [name], MD No   Overview Addendum 03/21/2021 11:33 AM by Conard Novak, MD    Clinic [facility] Prenatal Labs  Dating LMP = 10 week [place] Blood type: A/Positive/-- (10/11 1209)   Genetic Screen Inheritest negative SMA, CF, and Fragile-X     NIPS: Normal XX Antibody:Negative (10/11 1209)  Anatomic [place] Normal 12/05/2020 Rubella: 3.49 (10/11 1209) Varicella: Immune  GTT Early:               Third trimester:  RPR: Non Reactive (10/11 1209)   Rhogam N/A HBsAg: Negative (10/11 1209)   TDaP vaccine 02/01/21                       Flu Shot: 09/17/20 COVID vaccinated  HIV: Non Reactive (10/11 1209)   Baby Food                                GBS:   Contraception  Pap: 09/17/2020 NILM HPV negative  CBB     CS/VBAC N/A   Support Person            Previous Version   History of preterm delivery, currently pregnant 09/17/2020 by [name], MD No   Intermittent asthma, uncomplicated 02/26/2015 by Shanda Bumps No   Preterm delivery, delivered 04/22/2017 by Conard Novak, MD 10/19/2020 by  [name], MD       Term labor symptoms and general obstetric precautions including but not limited to vaginal bleeding, contractions, leaking of fluid and fetal movement were reviewed in detail with the patient. Please refer to After Visit Summary for other counseling recommendations.   Return for Keep previously scheduled apponitments.   Thomasene Mohair, MD, [name], [facility] 04/08/2021  12:06 PM

## 2021-04-11 ENCOUNTER — Other Ambulatory Visit: Payer: Self-pay

## 2021-04-12 MED ORDER — CYCLOBENZAPRINE HCL 10 MG PO TABS: 10.0000 mg | ORAL_TABLET | Freq: Three times a day (TID) | ORAL | 0 refills | Status: DC | PRN

## 2021-04-15 ENCOUNTER — Other Ambulatory Visit: Payer: Self-pay

## 2021-04-15 ENCOUNTER — Encounter: Payer: BC Managed Care – PPO | Admitting: Obstetrics and Gynecology

## 2021-04-15 ENCOUNTER — Inpatient Hospital Stay
Admission: EM | Admit: 2021-04-15 | Discharge: 2021-04-18 | DRG: 787 | Disposition: A | Discharge status: Home / Self Care | Payer: BC Managed Care – PPO | Attending: Obstetrics and Gynecology | Admitting: Obstetrics and Gynecology

## 2021-04-15 ENCOUNTER — Inpatient Hospital Stay: Payer: BC Managed Care – PPO | Admitting: Certified Registered Nurse Anesthetist

## 2021-04-15 ENCOUNTER — Telehealth: Payer: Self-pay

## 2021-04-15 ENCOUNTER — Encounter
Admission: EM | Disposition: A | Discharge status: Home / Self Care | Payer: Self-pay | Source: Home / Self Care | Attending: Obstetrics and Gynecology

## 2021-04-15 ENCOUNTER — Encounter: Payer: Self-pay | Admitting: Obstetrics and Gynecology

## 2021-04-15 DIAGNOSIS — J45909 Unspecified asthma, uncomplicated: Secondary | ICD-10-CM | POA: Diagnosis present

## 2021-04-15 DIAGNOSIS — F32A Depression, unspecified: Secondary | ICD-10-CM | POA: Diagnosis present

## 2021-04-15 DIAGNOSIS — Z20822 Contact with and (suspected) exposure to covid-19: Secondary | ICD-10-CM | POA: Diagnosis present

## 2021-04-15 DIAGNOSIS — O26893 Other specified pregnancy related conditions, third trimester: Secondary | ICD-10-CM | POA: Diagnosis present

## 2021-04-15 DIAGNOSIS — Z3A38 38 weeks gestation of pregnancy: Secondary | ICD-10-CM | POA: Diagnosis not present

## 2021-04-15 DIAGNOSIS — O0990 Supervision of high risk pregnancy, unspecified, unspecified trimester: Secondary | ICD-10-CM

## 2021-04-15 DIAGNOSIS — O321XX0 Maternal care for breech presentation, not applicable or unspecified: Secondary | ICD-10-CM | POA: Diagnosis present

## 2021-04-15 DIAGNOSIS — O09899 Supervision of other high risk pregnancies, unspecified trimester: Secondary | ICD-10-CM

## 2021-04-15 DIAGNOSIS — O9081 Anemia of the puerperium: Secondary | ICD-10-CM | POA: Diagnosis not present

## 2021-04-15 DIAGNOSIS — O9952 Diseases of the respiratory system complicating childbirth: Secondary | ICD-10-CM | POA: Diagnosis present

## 2021-04-15 DIAGNOSIS — O99344 Other mental disorders complicating childbirth: Secondary | ICD-10-CM | POA: Diagnosis present

## 2021-04-15 DIAGNOSIS — Z87891 Personal history of nicotine dependence: Secondary | ICD-10-CM

## 2021-04-15 DIAGNOSIS — D62 Acute posthemorrhagic anemia: Secondary | ICD-10-CM | POA: Diagnosis not present

## 2021-04-15 DIAGNOSIS — F411 Generalized anxiety disorder: Secondary | ICD-10-CM | POA: Diagnosis present

## 2021-04-15 DIAGNOSIS — O329XX0 Maternal care for malpresentation of fetus, unspecified, not applicable or unspecified: Secondary | ICD-10-CM | POA: Diagnosis present

## 2021-04-15 DIAGNOSIS — J4520 Mild intermittent asthma, uncomplicated: Secondary | ICD-10-CM

## 2021-04-15 LAB — TYPE AND SCREEN
ABO/RH(D): A POS
Antibody Screen: NEGATIVE

## 2021-04-15 LAB — RESP PANEL BY RT-PCR (FLU A&B, COVID) ARPGX2
Influenza A by PCR: NEGATIVE
Influenza B by PCR: NEGATIVE
SARS Coronavirus 2 by RT PCR: NEGATIVE

## 2021-04-15 LAB — RAPID HIV SCREEN (HIV 1/2 AB+AG)
HIV 1/2 Antibodies: NONREACTIVE
HIV-1 P24 Antigen - HIV24: NONREACTIVE

## 2021-04-15 LAB — CBC
HCT: 41.8 % (ref 36.0–46.0)
Hemoglobin: 14.2 g/dL (ref 12.0–15.0)
MCH: 29.5 pg (ref 26.0–34.0)
MCHC: 34 g/dL (ref 30.0–36.0)
MCV: 86.9 fL (ref 80.0–100.0)
Platelets: 208 10*3/uL (ref 150–400)
RBC: 4.81 MIL/uL (ref 3.87–5.11)
RDW: 13.3 % (ref 11.5–15.5)
WBC: 9.3 10*3/uL (ref 4.0–10.5)
nRBC: 0 % (ref 0.0–0.2)

## 2021-04-15 SURGERY — Surgical Case
Anesthesia: Spinal

## 2021-04-15 MED ORDER — SODIUM CHLORIDE 0.9% FLUSH: 3.0000 mL | INTRAVENOUS | Status: DC | PRN

## 2021-04-15 MED ORDER — CEFAZOLIN SODIUM-DEXTROSE 2-4 GM/100ML-% IV SOLN
INTRAVENOUS | Status: AC
  Filled 2021-04-15: qty 100

## 2021-04-15 MED ORDER — BENZOCAINE-MENTHOL 20-0.5 % EX AERO: 1.0000 "application " | INHALATION_SPRAY | CUTANEOUS | Status: DC | PRN

## 2021-04-15 MED ORDER — ONDANSETRON HCL 4 MG/2ML IJ SOLN
INTRAMUSCULAR | Status: AC
  Filled 2021-04-15: qty 2

## 2021-04-15 MED ORDER — SERTRALINE HCL 25 MG PO TABS
50.0000 mg | ORAL_TABLET | Freq: Every day | ORAL | Status: DC
  Filled 2021-04-15: qty 2
  Filled 2021-04-15: qty 1

## 2021-04-15 MED ORDER — DIPHENHYDRAMINE HCL 25 MG PO CAPS: 25.0000 mg | ORAL_CAPSULE | ORAL | Status: DC | PRN

## 2021-04-15 MED ORDER — MORPHINE SULFATE (PF) 0.5 MG/ML IJ SOLN
INTRAMUSCULAR | Status: AC
  Filled 2021-04-15: qty 10

## 2021-04-15 MED ORDER — ACETAMINOPHEN 500 MG PO TABS
1000.0000 mg | ORAL_TABLET | Freq: Four times a day (QID) | ORAL | Status: DC
  Administered 2021-04-16 – 2021-04-18 (×9): 1000 mg via ORAL
  Filled 2021-04-15 (×9): qty 2

## 2021-04-15 MED ORDER — BUPIVACAINE IN DEXTROSE 0.75-8.25 % IT SOLN
INTRATHECAL | Status: DC | PRN
  Administered 2021-04-15: 1.6 mL via INTRATHECAL

## 2021-04-15 MED ORDER — ACETAMINOPHEN 500 MG PO TABS: 1000.0000 mg | ORAL_TABLET | Freq: Four times a day (QID) | ORAL | Status: DC

## 2021-04-15 MED ORDER — BUPIVACAINE HCL (PF) 0.5 % IJ SOLN: 5.0000 mL | Freq: Once | INTRAMUSCULAR | Status: DC

## 2021-04-15 MED ORDER — ONDANSETRON HCL 4 MG PO TABS
4.0000 mg | ORAL_TABLET | ORAL | Status: DC | PRN
  Filled 2021-04-15: qty 1

## 2021-04-15 MED ORDER — FENTANYL CITRATE (PF) 100 MCG/2ML IJ SOLN
INTRAMUSCULAR | Status: AC
  Filled 2021-04-15: qty 2

## 2021-04-15 MED ORDER — ONDANSETRON HCL 4 MG/2ML IJ SOLN
4.0000 mg | Freq: Three times a day (TID) | INTRAMUSCULAR | Status: DC | PRN
  Administered 2021-04-15: 4 mg via INTRAVENOUS

## 2021-04-15 MED ORDER — CEFAZOLIN SODIUM-DEXTROSE 2-4 GM/100ML-% IV SOLN
2.0000 g | INTRAVENOUS | Status: AC
  Administered 2021-04-15: 2 g via INTRAVENOUS

## 2021-04-15 MED ORDER — MORPHINE SULFATE (PF) 0.5 MG/ML IJ SOLN
INTRAMUSCULAR | Status: DC | PRN
  Administered 2021-04-15: .1 mg via INTRATHECAL

## 2021-04-15 MED ORDER — PRENATAL MULTIVITAMIN CH
1.0000 | ORAL_TABLET | Freq: Every day | ORAL | Status: DC
  Administered 2021-04-16 – 2021-04-17 (×2): 1 via ORAL
  Filled 2021-04-15 (×2): qty 1

## 2021-04-15 MED ORDER — DOCUSATE SODIUM 100 MG PO CAPS
100.0000 mg | ORAL_CAPSULE | Freq: Two times a day (BID) | ORAL | Status: DC
  Administered 2021-04-16 – 2021-04-17 (×4): 100 mg via ORAL
  Filled 2021-04-15 (×4): qty 1

## 2021-04-15 MED ORDER — NALOXONE HCL 4 MG/10ML IJ SOLN
1.0000 ug/kg/h | INTRAVENOUS | Status: DC | PRN
  Filled 2021-04-15: qty 5

## 2021-04-15 MED ORDER — CYCLOBENZAPRINE HCL 10 MG PO TABS
10.0000 mg | ORAL_TABLET | Freq: Three times a day (TID) | ORAL | Status: DC | PRN
  Filled 2021-04-15: qty 1

## 2021-04-15 MED ORDER — IBUPROFEN 600 MG PO TABS: 600.0000 mg | ORAL_TABLET | Freq: Four times a day (QID) | ORAL | Status: DC

## 2021-04-15 MED ORDER — COCONUT OIL OIL
1.0000 "application " | TOPICAL_OIL | Status: DC | PRN
  Filled 2021-04-15: qty 120

## 2021-04-15 MED ORDER — OXYTOCIN-SODIUM CHLORIDE 30-0.9 UT/500ML-% IV SOLN
INTRAVENOUS | Status: DC | PRN
  Administered 2021-04-15: 30 [IU] via INTRAVENOUS

## 2021-04-15 MED ORDER — BUPIVACAINE HCL (PF) 0.5 % IJ SOLN
INTRAMUSCULAR | Status: DC | PRN
  Administered 2021-04-15: 10 mL

## 2021-04-15 MED ORDER — ALBUTEROL SULFATE HFA 108 (90 BASE) MCG/ACT IN AERS
2.0000 | INHALATION_SPRAY | RESPIRATORY_TRACT | Status: DC | PRN
  Filled 2021-04-15: qty 6.7

## 2021-04-15 MED ORDER — OXYTOCIN-SODIUM CHLORIDE 30-0.9 UT/500ML-% IV SOLN
INTRAVENOUS | Status: AC
  Filled 2021-04-15: qty 1000

## 2021-04-15 MED ORDER — WITCH HAZEL-GLYCERIN EX PADS: 1.0000 "application " | MEDICATED_PAD | CUTANEOUS | Status: DC | PRN

## 2021-04-15 MED ORDER — ONDANSETRON HCL 4 MG/2ML IJ SOLN
INTRAMUSCULAR | Status: DC | PRN
  Administered 2021-04-15: 4 mg via INTRAVENOUS

## 2021-04-15 MED ORDER — SCOPOLAMINE 1 MG/3DAYS TD PT72
1.0000 | MEDICATED_PATCH | Freq: Once | TRANSDERMAL | Status: DC
  Administered 2021-04-15: 1.5 mg via TRANSDERMAL
  Filled 2021-04-15: qty 1

## 2021-04-15 MED ORDER — DIBUCAINE (PERIANAL) 1 % EX OINT: 1.0000 "application " | TOPICAL_OINTMENT | CUTANEOUS | Status: DC | PRN

## 2021-04-15 MED ORDER — SODIUM CHLORIDE 0.9 % IV SOLN
INTRAVENOUS | Status: DC | PRN
  Administered 2021-04-15: 50 ug/min via INTRAVENOUS

## 2021-04-15 MED ORDER — LACTATED RINGERS IV SOLN: INTRAVENOUS | Status: DC | PRN

## 2021-04-15 MED ORDER — DIPHENHYDRAMINE HCL 25 MG PO CAPS: 25.0000 mg | ORAL_CAPSULE | Freq: Four times a day (QID) | ORAL | Status: DC | PRN

## 2021-04-15 MED ORDER — NALOXONE HCL 0.4 MG/ML IJ SOLN: 0.4000 mg | INTRAMUSCULAR | Status: DC | PRN

## 2021-04-15 MED ORDER — NALBUPHINE HCL 10 MG/ML IJ SOLN: 5.0000 mg | INTRAMUSCULAR | Status: DC | PRN

## 2021-04-15 MED ORDER — DIPHENHYDRAMINE HCL 50 MG/ML IJ SOLN: 12.5000 mg | INTRAMUSCULAR | Status: DC | PRN

## 2021-04-15 MED ORDER — SIMETHICONE 80 MG PO CHEW: 80.0000 mg | CHEWABLE_TABLET | ORAL | Status: DC | PRN

## 2021-04-15 MED ORDER — SOD CITRATE-CITRIC ACID 500-334 MG/5ML PO SOLN
ORAL | Status: AC
  Administered 2021-04-15: 30 mL
  Filled 2021-04-15: qty 15

## 2021-04-15 MED ORDER — OXYCODONE HCL 5 MG PO TABS
5.0000 mg | ORAL_TABLET | ORAL | Status: DC | PRN
  Administered 2021-04-16 – 2021-04-17 (×2): 5 mg via ORAL
  Filled 2021-04-15 (×4): qty 1

## 2021-04-15 MED ORDER — FENTANYL CITRATE (PF) 100 MCG/2ML IJ SOLN
INTRAMUSCULAR | Status: DC | PRN
  Administered 2021-04-15: 15 ug via INTRATHECAL

## 2021-04-15 MED ORDER — SOD CITRATE-CITRIC ACID 500-334 MG/5ML PO SOLN: 30.0000 mL | ORAL | Status: DC

## 2021-04-15 MED ORDER — KETOROLAC TROMETHAMINE 30 MG/ML IJ SOLN
30.0000 mg | Freq: Four times a day (QID) | INTRAMUSCULAR | Status: AC
  Administered 2021-04-15 – 2021-04-16 (×4): 30 mg via INTRAVENOUS
  Filled 2021-04-15 (×4): qty 1

## 2021-04-15 MED ORDER — NALBUPHINE HCL 10 MG/ML IJ SOLN
5.0000 mg | Freq: Once | INTRAMUSCULAR | Status: DC | PRN
Start: 2021-04-15 — End: 2021-04-17

## 2021-04-15 MED ORDER — ZOLPIDEM TARTRATE 5 MG PO TABS: 5.0000 mg | ORAL_TABLET | Freq: Every evening | ORAL | Status: DC | PRN

## 2021-04-15 MED ORDER — NALBUPHINE HCL 10 MG/ML IJ SOLN
5.0000 mg | INTRAMUSCULAR | Status: DC | PRN
Start: 2021-04-15 — End: 2021-04-17
  Administered 2021-04-16 (×2): 5 mg via INTRAVENOUS
  Filled 2021-04-15 (×2): qty 1

## 2021-04-15 MED ORDER — BUPIVACAINE HCL (PF) 0.5 % IJ SOLN
INTRAMUSCULAR | Status: AC
  Filled 2021-04-15: qty 30

## 2021-04-15 MED ORDER — CALCIUM CARBONATE ANTACID 500 MG PO CHEW
1.0000 | CHEWABLE_TABLET | ORAL | Status: DC | PRN
  Administered 2021-04-15: 200 mg via ORAL
  Filled 2021-04-15: qty 1

## 2021-04-15 MED ORDER — ONDANSETRON HCL 4 MG/2ML IJ SOLN
4.0000 mg | INTRAMUSCULAR | Status: DC | PRN
  Filled 2021-04-15: qty 2

## 2021-04-15 MED ORDER — OXYCODONE HCL 5 MG PO TABS
10.0000 mg | ORAL_TABLET | ORAL | Status: DC | PRN
  Administered 2021-04-17 (×3): 10 mg via ORAL
  Filled 2021-04-15 (×2): qty 2

## 2021-04-15 MED ORDER — KETOROLAC TROMETHAMINE 30 MG/ML IJ SOLN
INTRAMUSCULAR | Status: DC | PRN
  Administered 2021-04-15: 30 mg via INTRAVENOUS

## 2021-04-15 MED ORDER — KETOROLAC TROMETHAMINE 30 MG/ML IJ SOLN
INTRAMUSCULAR | Status: AC
  Filled 2021-04-15: qty 1

## 2021-04-15 MED ORDER — BUPIVACAINE 0.25 % ON-Q PUMP DUAL CATH 400 ML
400.0000 mL | INJECTION | Status: DC
  Filled 2021-04-15: qty 400

## 2021-04-15 SURGICAL SUPPLY — 33 items
ADH SKN CLS APL DERMABOND .7 (GAUZE/BANDAGES/DRESSINGS) ×1
CATH KIT ON-Q SILVERSOAK 5 (CATHETERS) ×2 IMPLANT
CATH KIT ON-Q SILVERSOAK 5IN (CATHETERS) ×4 IMPLANT
COVER WAND RF STERILE (DRAPES) ×2 IMPLANT
DERMABOND ADVANCED (GAUZE/BANDAGES/DRESSINGS) ×1
DERMABOND ADVANCED .7 DNX12 (GAUZE/BANDAGES/DRESSINGS) ×1 IMPLANT
DRSG OPSITE POSTOP 4X10 (GAUZE/BANDAGES/DRESSINGS) ×2 IMPLANT
DRSG TELFA 3X8 NADH (GAUZE/BANDAGES/DRESSINGS) ×2 IMPLANT
ELECT CAUTERY BLADE 6.4 (BLADE) ×2 IMPLANT
ELECT REM PT RETURN 9FT ADLT (ELECTROSURGICAL) ×2
ELECTRODE REM PT RTRN 9FT ADLT (ELECTROSURGICAL) ×1 IMPLANT
GAUZE SPONGE 4X4 12PLY STRL (GAUZE/BANDAGES/DRESSINGS) ×2 IMPLANT
GLOVE SURG ENC MOIS LTX SZ7 (GLOVE) ×2 IMPLANT
GLOVE SURG UNDER LTX SZ7.5 (GLOVE) ×2 IMPLANT
GOWN STRL REUS W/ TWL LRG LVL3 (GOWN DISPOSABLE) ×3 IMPLANT
GOWN STRL REUS W/TWL LRG LVL3 (GOWN DISPOSABLE) ×6
MANIFOLD NEPTUNE II (INSTRUMENTS) ×2 IMPLANT
MAT PREVALON FULL STRYKER (MISCELLANEOUS) ×2 IMPLANT
NS IRRIG 1000ML POUR BTL (IV SOLUTION) ×2 IMPLANT
PACK C SECTION AR (MISCELLANEOUS) ×2 IMPLANT
PAD DRESSING TELFA 3X8 NADH (GAUZE/BANDAGES/DRESSINGS) ×1 IMPLANT
PAD OB MATERNITY 4.3X12.25 (PERSONAL CARE ITEMS) ×4 IMPLANT
PAD PREP 24X41 OB/GYN DISP (PERSONAL CARE ITEMS) ×2 IMPLANT
PENCIL SMOKE EVACUATOR (MISCELLANEOUS) ×2 IMPLANT
STRIP CLOSURE SKIN 1/2X4 (GAUZE/BANDAGES/DRESSINGS) ×2 IMPLANT
SUT MNCRL 4-0 (SUTURE) ×2
SUT MNCRL 4-0 27XMFL (SUTURE) ×1
SUT PDS AB 1 TP1 96 (SUTURE) ×2 IMPLANT
SUT PLAIN GUT 0 (SUTURE) IMPLANT
SUT VIC AB 0 CTX 36 (SUTURE) ×4
SUT VIC AB 0 CTX36XBRD ANBCTRL (SUTURE) ×2 IMPLANT
SUTURE MNCRL 4-0 27XMF (SUTURE) ×1 IMPLANT
SWABSTK COMLB BENZOIN TINCTURE (MISCELLANEOUS) ×2 IMPLANT

## 2021-04-15 NOTE — Telephone Encounter (Signed)
Pt tx'd from [place]; pt thinks she is in labor; c/o pressure, pain is more in intense; ctxs irratic.  Adv to be seen in office.  TN to schedule.

## 2021-04-15 NOTE — Anesthesia Procedure Notes (Signed)
Spinal  Patient location during procedure: OR Start time: 04/15/2021 2:42 PM End time: 04/15/2021 2:44 PM Reason for block: surgical anesthesia Staffing Performed: resident/CRNA  Anesthesiologist: Tera Mater, MD Resident/CRNA: Hedda Slade, CRNA Preanesthetic Checklist Completed: patient identified, IV checked, site marked, risks and benefits discussed, surgical consent, monitors and equipment checked, pre-op evaluation and timeout performed Spinal Block Patient position: sitting Prep: ChloraPrep Patient monitoring: heart rate, continuous pulse ox, blood pressure and cardiac monitor Approach: midline Location: L3-4 Injection technique: single-shot Needle Needle type: Whitacre and Introducer  Needle gauge: 24 G Needle length: 9 cm Assessment Sensory level: T4 Events: CSF return Additional Notes Negative paresthesia. Negative blood return. Positive free-flowing CSF. Expiration date of kit checked and confirmed. Patient tolerated procedure well, without complications.

## 2021-04-15 NOTE — Progress Notes (Signed)
Pt complains of heartburn that makes her feel nauseous. Scopalamine patch placed earlier and PRN zofran given with no relief. Paged provider. M. Buddy Duty verbal order for Tums chewable PRN every 2 hours.

## 2021-04-15 NOTE — Op Note (Signed)
Cesarean Section Procedure Note 04/15/21  Pre-operative Diagnosis: 1. Breech Presentation 2. Active labor- [redacted] week gestation Post-operative Diagnosis: same, delivered. Procedure: Primary Low Transverse Cesarean Section  Surgeon: Adelene Idler MD   Assistant(s): Paula Compton CNM - No other skilled surgical assistant available. Anesthesia: Spinal Estimated Blood Loss: 525 cc Complications: None; patient tolerated the procedure well.   Disposition: PACU - hemodynamically stable. Condition: stable   Findings: A female infant in the cephalic presentation. Amniotic fluid - clear   Birth weight: 7 lbs 10oz Apgars of 8 and 9.  Intact placenta with a three-vessel cord. Grossly normal uterus, tubes and ovaries bilaterally. No intraabdominal adhesions were noted.   Procedure Details    The patient was taken to operating room, identified as the correct patient and the procedure verified as C-Section Delivery. A time out was held and the above information confirmed. After induction of anesthesia, the patient was draped and prepped in the usual sterile manner. A Pfannenstiel incision was made and carried down through the subcutaneous tissue to the fascia. Fascial incision was made and extended transversely with the Mayo scissors. The fascia was separated from the underlying rectus tissue superiorly and inferiorly. The peritoneum was identified and entered bluntly. Peritoneal incision was extended longitudinally. A low transverse hysterotomy was made. The fetus was delivered atraumatically from the breech position using usual maneuvers. The umbilical cord was clamped x2 and cut and the infant was handed to the awaiting pediatricians. The placenta was removed intact and appeared normal with a 3-vessel cord. The uterus was exteriorized and cleared of all clot and debris. The hysterotomy was closed with running sutures of 0 Vicryl suture. A second imbricating layer was placed with the same suture.  Excellent hemostasis was observed.  The uterus was returned to the abdomen. The pelvis was irrigated and again, excellent hemostasis was noted. The peritoneum was closed with a running stitch of 2-0 Vicryl. The On Q Pain pump System was then placed.  Trocars were placed through the abdominal wall into the subfascial space and these were used to thread the silver soaker cathaters into place.The rectus muscles were inspected and were hemostatic. The rectus fascia was then reapproximated with running sutures of 0-vicryl, with careful placement not to incorporate the cathaters. Subcutaneous tissues are then irrigated with saline and hemostasis assured with the bovie. The subcutaneous fat was approximated with 3-0 plain and a running stitch.  The skin was closed with 4-0 monocryl suture in a subcuticular fashion followed by skin adhesive. The cathaters are flushed each with 5 mL of Bupivicaine and stabilized into place with dressing. Instrument, sponge, and needle counts were correct prior to the abdominal closure and at the conclusion of the case.  The patient tolerated the procedure well and was transferred to the recovery room in stable condition.   Paula Compton assisted with this case. This was a high level case requiring a Financial controller. No other assistant was readily available. Paula Compton assisted with retraction and suction during the case.   Natale Milch MD [facility], [facility] 04/15/21 3:49 PM

## 2021-04-15 NOTE — Anesthesia Preprocedure Evaluation (Addendum)
Anesthesia Evaluation  Patient identified by MRN, date of birth, ID band Patient awake    Reviewed: Allergy & Precautions, H&P , NPO status , Patient's Chart, lab work & pertinent test results  Airway Mallampati: II  TM Distance: >3 FB Neck ROM: full    Dental  (+) Teeth Intact   Pulmonary asthma , former smoker,    breath sounds clear to auscultation       Cardiovascular Exercise Tolerance: Good (-) hypertensionnegative cardio ROS   Rhythm:regular Rate:Normal     Neuro/Psych PSYCHIATRIC DISORDERS Anxiety Depression    GI/Hepatic negative GI ROS,   Endo/Other    Renal/GU   negative genitourinary   Musculoskeletal   Abdominal   Peds  Hematology negative hematology ROS (+)   Anesthesia Other Findings Breech and laboring  Past Medical History: No date: Anxiety and depression No date: Asthma No date: Eczema No date: Interstitial cystitis No date: PCOS (polycystic ovarian syndrome) No date: Premature labor before 37 weeks of gestation with delivery,  delivered     Comment:  PPROM at 34 weeks  Past Surgical History: 02/24/2013: Excision of lymph node     Comment:  sublingual lymph node: Dr Gertie Baron 06/01/2017: INTRAUTERINE DEVICE INSERTION     Comment:  [facility] No date: WISDOM TOOTH EXTRACTION     Reproductive/Obstetrics (+) Pregnancy                             Anesthesia Physical Anesthesia Plan  ASA: II  Anesthesia Plan: Spinal   Post-op Pain Management:    Induction:   PONV Risk Score and Plan:   Airway Management Planned:   Additional Equipment:   Intra-op Plan:   Post-operative Plan:   Informed Consent: I have reviewed the patients History and Physical, chart, labs and discussed the procedure including the risks, benefits and alternatives for the proposed anesthesia with the patient or authorized representative who has indicated his/her understanding  and acceptance.     Dental Advisory Given  Plan Discussed with: Anesthesiologist  Anesthesia Plan Comments:        Anesthesia Quick Evaluation

## 2021-04-15 NOTE — Discharge Instructions (Signed)
Discharge Instructions:   Follow-up Appointment: 1 week, please call the office to schedule  If there are any new medications, they have been ordered and will be available for pickup at the listed pharmacy on your way home from the hospital.   Call the office if you have any of the following: headache, visual changes, fever >101.0 F, chills, shortness of breath, breast concerns, excessive vaginal bleeding, incision drainage or problems, leg pain or redness, depression or any other concerns. If you have vaginal discharge with an odor, let your doctor know.   It is normal to bleed for up to 6 weeks. You should not soak through more than 1 pad in 1 hour. If you have a blood clot larger than your fist with continued bleeding, call your doctor.   After a c-section, you should expect a small amount of blood or clear fluid coming from the incision and abdominal cramping/soreness. Inspect your incision site daily. Stand in front of a mirror to look for any redness, incision opening, or discolored/odorness drainage. Take a shower daily and continue good hygiene. Use own towel and washcloth (do not share). Make sure your sheets on your bed are clean. No pets sleeping around your incision site. Dressing will be removed at your postpartum visit. If the dressing does become wet or soiled underneath, it is okay to remove it before your visit.    On-Q pump: You will remove on day 4 after insertion or if the ball becomes flat before day 4. You will remove on: 04/19/2021  Activity: Do not lift > 15 lbs for 6 weeks (do not lift anything heavier than your baby). No intercourse, tampons, swimming pools, hot tubs, baths (only showers) for 6 weeks.  No driving for 1-2 weeks. Do not drive while taking narcotic or opioid pain medication.  Continue taking your prenatal vitamin, especially if breastfeeding. Increase calories and fluids (water) while breastfeeding.   Your milk will come in, in the next couple of days (right  now it is colostrum). You may have a slight fever when your milk comes in, but it should go away on its own.  If it does not, and rises above 101 F please call the doctor. You will also feel achy and your breasts will be firm. They will also start to leak. If you are breastfeeding, continue as you have been and you can pump/express milk for comfort.   If you have too much milk, your breasts can become engorged, which could lead to mastitis. This is an infection of the milk ducts. It can be very painful and you will need to notify your doctor to obtain a prescription for antibiotics. You can also treat it with a shower or hot/cold compress.   For concerns about your baby, please call your pediatrician.  For breastfeeding concerns, the lactation consultant can be reached at [phone].   Postpartum blues (feelings of happy one minute and sad another minute) are normal for the first few weeks but if it gets worse let your doctor know.   Congratulations! We enjoyed caring for you and your new bundle of joy!

## 2021-04-15 NOTE — Progress Notes (Signed)
RN noted bleeding under tegaderm where OnQ pump catheters connect into the skin; unable to tell where exactly bleeding was coming from. Eunice Blase, CNM contacted and asked to assess pt. in person. Upon inspection, bleeding appeared to come from insertion sites. Additional dermabond applied to insertion sites, area thoroughly cleaned, and new steri-strips and tegaderm applied. OnQ pump reconnected and infusing with no further issues. Site clean, dry, and intact.

## 2021-04-15 NOTE — H&P (Signed)
Kaitlin [name] is an 35 y.o. female.   Chief Complaint: utine contractions HPI: Patient presented to labor and delivery complaining of contractions.  She reports that the contractions started at 11 AM.  She denies any leakage of fluid or vaginal bleeding.  Exam by nursing she was found to be 5 cm.  Fire confirmed breech presentation on bedside ultrasound.  Reports normal fetal movement.  Denies any headaches right upper quadrant pain.  Reports normal good fetal movement.  Past Medical History:  Diagnosis Date  . Anxiety and depression   . Asthma   . Eczema   . Interstitial cystitis   . PCOS (polycystic ovarian syndrome)   . Premature labor before 37 weeks of gestation with delivery, delivered    PPROM at 34 weeks    Past Surgical History:  Procedure Laterality Date  . Excision of lymph node  02/24/2013   sublingual lymph node: Dr Gertie Baron  . INTRAUTERINE DEVICE INSERTION  06/01/2017   [facility]  . WISDOM TOOTH EXTRACTION      Family History  Problem Relation Age of Onset  . Multiple sclerosis Mother   . Breast cancer Maternal Grandmother        4s  . Hypertension Maternal Grandfather   . Thyroid cancer Maternal Grandfather   . Lung cancer Paternal Grandmother   . Hypertension Paternal Grandmother   . Diabetes Mellitus II Paternal Grandfather   . Hypertension Paternal Grandfather    Social History:  reports that she quit smoking about 13 years ago. She has never used smokeless tobacco. She reports that she does not drink alcohol and does not use drugs.  Allergies:  Allergies  Allergen Reactions  . Chocolate Flavor Rash  . Sulfa Antibiotics Shortness Of Breath  . Sulfamethoxazole-Trimethoprim Shortness Of Breath  . Latex Hives  . Metronidazole Hives    Medications Prior to Admission  Medication Sig Dispense Refill  . acetaminophen (TYLENOL) 500 MG tablet Take 500 mg by mouth every 6 (six) hours as needed for mild pain or headache.    . Cholecalciferol  (VITAMIN D3) 125 MCG (5000 UT) TABS Take 5,000 Units by mouth daily.    . Cranberry-Vitamin C-Vitamin E (CRANBERRY PLUS VITAMIN C PO) Take 2 tablets by mouth daily.    . cyclobenzaprine (FLEXERIL) 10 MG tablet Take 1 tablet (10 mg total) by mouth 3 (three) times daily as needed for muscle spasms. 30 tablet 0  . diphenhydramine-acetaminophen (TYLENOL PM) 25-500 MG TABS tablet Take 1 tablet by mouth at bedtime.    . prenatal vitamin w/FE, FA (PRENATAL 1 + 1) 27-1 MG TABS tablet Take 1 tablet by mouth daily at 12 noon.    . Probiotic Product (PROBIOTIC PO) Take 1 tablet by mouth daily.    . sertraline (ZOLOFT) 50 MG tablet TAKE 1 TABLET BY MOUTH EVERY DAY (Patient taking differently: Take 50 mg by mouth at bedtime.) 90 tablet 1  . albuterol (VENTOLIN HFA) 108 (90 Base) MCG/ACT inhaler Inhale 2 puffs into the lungs every 4 (four) hours as needed for wheezing or shortness of breath. (Patient not taking: Reported on 04/15/2021) 1 Inhaler 3  . OVER THE COUNTER MEDICATION Alka Seltzer Gum - calcium carbonate. Chew one piece of gum every 6 hours as needed for heartburn. (Patient not taking: Reported on 04/15/2021)      No results found for this or any previous visit (from the past 48 hour(s)). No results found.  Review of Systems  Constitutional: Negative for chills and fever.  HENT: Negative for congestion, hearing loss and sinus pain.   Respiratory: Negative for cough, shortness of breath and wheezing.   Cardiovascular: Negative for chest pain, palpitations and leg swelling.  Gastrointestinal: Negative for abdominal pain, constipation, diarrhea, nausea and vomiting.  Genitourinary: Negative for dysuria, flank pain, frequency, hematuria and urgency.  Musculoskeletal: Negative for back pain.  Skin: Negative for rash.  Neurological: Negative for dizziness and headaches.  Psychiatric/Behavioral: Negative for suicidal ideas. The patient is not nervous/anxious.     Last menstrual period 07/20/2020, unknown  if currently breastfeeding. Physical Exam Vitals and nursing note reviewed.  Constitutional:      Appearance: She is well-developed.  HENT:     Head: Normocephalic and atraumatic.  Cardiovascular:     Rate and Rhythm: Normal rate and regular rhythm.  Pulmonary:     Effort: Pulmonary effort is normal.     Breath sounds: Normal breath sounds.  Abdominal:     General: Bowel sounds are normal.     Palpations: Abdomen is soft.  Musculoskeletal:        General: Normal range of motion.  Skin:    General: Skin is warm and dry.  Neurological:     Mental Status: She is alert and oriented to person, place, and time.  Psychiatric:        Behavior: Behavior normal.        Thought Content: Thought content normal.        Judgment: Judgment normal.    SVE by nursing: 5/60/-3  NST: 145 bpm baseline, moderate variability, 15x15 accelerations, no decelerations. Tocometer : every 2 minute   Assessment/Plan 35 y.o. G2P0101 at [redacted]w[redacted]d with history of breech presentation in labor, will proceed with emergent cesarean section for breech presentation in active labor  Discussed risks, benefits, and alternatives with the patient for the cesarean section.  Discussed the risk of infection bleeding and damage to surrounding pelvic tissues including but not limited to the uterus, fallopian tubes, ovaries, bowel, and bladder.  Discussed proper care for the incision postoperatively to avoid infection and signs and symptoms of infection to watch for.  Discussed possibility of an emergent blood transfusion for heavy blood loss.  Discussed risks associated with blood transfusion including transfusion reaction and chronic infections, or sepsis which could lead to death.  Patient gave consent for the procedure and blood transfusion if needed. Consent forms were completed.   Natale Milch, MD 04/15/2021, 1:32 PM

## 2021-04-15 NOTE — Progress Notes (Signed)
Discussed with anesthesia, type and screen is still not back, another 20 minutes remain, patient has a history of fast labors and I feel that it is medically necessary to go now with the cesarean section because of this. Anesthesia understands and agrees to proceed.   Adelene Idler MD, [name] 04/15/2021 2:26 PM

## 2021-04-15 NOTE — Lactation Note (Signed)
This note was copied from a baby's chart. Lactation Consultation Note  Patient Name: Kaitlin [name] Date: 04/15/2021   Age:35 hours  Mom had already started breast feeding [name] when she got to [facility] after her C/S when lactation entered room.  She breast fed well with strong, rhythmic sucks on right breast.  She was getting a little frustrated on the left breast probably because she was passing gas and working on a meconium stool.  Mom breast fed her first baby who is now 66 years old for 2 years and 10 months.  She did have some challenges with her first who was born at 15 weeks and was in the [facility] for 29 days.  She had a tongue tie and had to use a nipple shield with the first baby.  Mom had plugged ducts a few times that led to mastitis.  Hand out given on what to expect with breast feeding the first 4 days of life and reviewed normal newborn stomach size, normal course of lactation, feeding cues and routine newborn feeding patterns.  Lactation [employer] given and discussed help even after discharge reviewing support groups, informational web sites and contact numbers.  Lactation name and number written on white board after mom came to room 340 on M/B unit and encouraged to call for questions, concerns or assistance and call for assistance for RN to help with breast feeding after LC leaves for the night.   Maternal Data    Feeding    LATCH Score                    Lactation Tools Discussed/Used    Interventions    Discharge    Consult Status      Louis Meckel 04/15/2021, 9:17 PM

## 2021-04-15 NOTE — Progress Notes (Signed)
   04/15/21 1340  Clinical Encounter Type  Visited With Patient and family together  Visit Type Initial;Spiritual support;Social support  Referral From Nurse  Consult/Referral To Chaplain   Chaplain responded to a spiritual consult from the nurse. PT requested an AD be completed. Chaplain did AD education with PT, and husband by bedside. Chaplain could not get it notarized, because nurse stated the PT would be taken to surgery immediately.  Pt will contact the chaplain tomorrow to get it notarized.

## 2021-04-15 NOTE — Transfer of Care (Signed)
Immediate Anesthesia Transfer of Care Note  Patient: Kaitlin [name]  Procedure(s) Performed: CESAREAN SECTION (N/A )  Patient Location: PACU  Anesthesia Type:Spinal  Level of Consciousness: awake, alert  and oriented  Airway & Oxygen Therapy: Patient Spontanous Breathing  Post-op Assessment: Report given to RN and Post -op Vital signs reviewed and stable  Post vital signs: Reviewed and stable  Last Vitals:  Vitals Value Taken Time  BP 120/72 04/15/21 1604  Temp    Pulse 69 04/15/21 1604  Resp 16 04/15/21 1604  SpO2 98 % 04/15/21 1604    Last Pain:  Vitals:   04/15/21 1604  PainSc: 0-No pain         Complications: No complications documented.

## 2021-04-16 ENCOUNTER — Encounter: Payer: Self-pay | Admitting: Obstetrics and Gynecology

## 2021-04-16 ENCOUNTER — Encounter: Payer: BC Managed Care – PPO | Admitting: Obstetrics and Gynecology

## 2021-04-16 ENCOUNTER — Other Ambulatory Visit: Admission: RE | Admit: 2021-04-16 | Payer: BC Managed Care – PPO | Source: Ambulatory Visit

## 2021-04-16 LAB — CBC
HCT: 36.4 % (ref 36.0–46.0)
Hemoglobin: 12.4 g/dL (ref 12.0–15.0)
MCH: 30 pg (ref 26.0–34.0)
MCHC: 34.1 g/dL (ref 30.0–36.0)
MCV: 88.1 fL (ref 80.0–100.0)
Platelets: 173 10*3/uL (ref 150–400)
RBC: 4.13 MIL/uL (ref 3.87–5.11)
RDW: 13.5 % (ref 11.5–15.5)
WBC: 11.7 10*3/uL — ABNORMAL HIGH (ref 4.0–10.5)
nRBC: 0 % (ref 0.0–0.2)

## 2021-04-16 LAB — RPR: RPR Ser Ql: NONREACTIVE

## 2021-04-16 MED ORDER — LACTATED RINGERS IV SOLN: INTRAVENOUS | Status: DC

## 2021-04-16 MED ORDER — OXYTOCIN-SODIUM CHLORIDE 30-0.9 UT/500ML-% IV SOLN
41.7000 mL/h | INTRAVENOUS | Status: DC
  Administered 2021-04-16: 41.7 mL/h via INTRAVENOUS

## 2021-04-16 MED ORDER — IBUPROFEN 600 MG PO TABS
600.0000 mg | ORAL_TABLET | Freq: Four times a day (QID) | ORAL | Status: DC
  Administered 2021-04-16 – 2021-04-18 (×6): 600 mg via ORAL
  Filled 2021-04-16 (×6): qty 1

## 2021-04-16 MED ORDER — OXYTOCIN-SODIUM CHLORIDE 30-0.9 UT/500ML-% IV SOLN
INTRAVENOUS | Status: AC
  Filled 2021-04-16: qty 500

## 2021-04-16 NOTE — Addendum Note (Signed)
Addendum  created 04/16/21 0759 by Lynden Oxford, CRNA   Clinical Note Signed

## 2021-04-16 NOTE — Lactation Note (Signed)
This note was copied from a baby's chart. Lactation Consultation Note  Patient Name: Kaitlin [name] [id number] Date: 04/16/2021 Reason for consult: Follow-up assessment;Early term 37-38.6wks;Other (Comment) (c-section) Age:35 hours  Lactation follow-up this morning.  Baby was spitty overnight, requiring suctioning in newborn nursery, still slightly spitty but mom feels it is resolving. Mom is an experienced breastfeeder, and feels that baby is getting better at the breast over the last 2 feeds and is tolerating the nipple shield well.  Baby's last feed was around 9:20am for 13 minutes total, feeding off both sides.   LC reviewed with parents newborn stomach size, feeding patterns and behaviors, early cues, and impact that gaggy/spitting can have on desire to eat. Encouraged spending time skin to skin and keeping baby upright as much as possible to help her settle better. Encouraged rest when possible.  Reviewed feeding attempts of 8-12x in first 24 hours, hand expression between feeds or if baby is sleepy or use of DEBP that is set-up in room for stimulation. Mom encouraged to call with next feeding.  Maternal Data Has patient been taught Hand Expression?: Yes Does the patient have breastfeeding experience prior to this delivery?: Yes How long did the patient breastfeed?: 38yr40mo  Feeding Mother's Current Feeding Choice: Breast Milk  LATCH Score                    Lactation Tools Discussed/Used    Interventions Interventions: Breast feeding basics reviewed;Education  Discharge    Consult Status Consult Status: Follow-up Date: 04/16/21 Follow-up type: In-patient    [name] 04/16/2021, 10:16 AM

## 2021-04-16 NOTE — Anesthesia Postprocedure Evaluation (Signed)
Anesthesia Post Note  Patient: Kaitlin [name]  Procedure(s) Performed: CESAREAN SECTION (N/A )  Patient location during evaluation: Mother Baby Anesthesia Type: Spinal Level of consciousness: oriented and awake and alert Pain management: pain level controlled Vital Signs Assessment: post-procedure vital signs reviewed and stable Respiratory status: spontaneous breathing and respiratory function stable Cardiovascular status: blood pressure returned to baseline and stable Postop Assessment: no headache, no backache, no apparent nausea or vomiting and able to ambulate Anesthetic complications: no   No complications documented.   Last Vitals:  Vitals:   04/16/21 0034 04/16/21 0302  BP: 121/70 115/73  Pulse: 84 75  Resp: 18 18  Temp: 36.9 C 36.9 C  SpO2: 97% 98%    Last Pain:  Vitals:   04/16/21 0302  TempSrc: Oral  PainSc:                  Lynden Oxford

## 2021-04-16 NOTE — Anesthesia Post-op Follow-up Note (Signed)
  Anesthesia Pain Follow-up Note  Patient: Kaitlin [name]  Day #: 1  Date of Follow-up: 04/16/2021 Time: 7:58 AM  Last Vitals:  Vitals:   04/16/21 0034 04/16/21 0302  BP: 121/70 115/73  Pulse: 84 75  Resp: 18 18  Temp: 36.9 C 36.9 C  SpO2: 97% 98%    Level of Consciousness: alert  Pain: mild   Side Effects:None  Catheter Site Exam:clean, dry     Plan: D/C from anesthesia care at surgeon's request  Lynden Oxford

## 2021-04-16 NOTE — Progress Notes (Signed)
Subjective:  Tolerating po.  No fevers, no chills.  Appropriate lochia.  Pain well controlled on oral analgesics  Objective:  Vital signs in last 24 hours: Temp:  [97.9 F (36.6 C)-98.4 F (36.9 C)] 98.3 F (36.8 C) (05/10 0816) Pulse Rate:  [58-84] 71 (05/10 0816) Resp:  [7-23] 18 (05/10 0816) BP: (115-140)/(70-95) 119/70 (05/10 0816) SpO2:  [97 %-100 %] 99 % (05/10 0816)    Intake/Output      05/09 0701 05/10 0700 05/10 0701 05/11 0700   P.O. 240    I.V. 888.9    IV Piggyback 100    Total Intake 1228.9    Urine 550 300   Blood 575    Total Output 1125 300   Net +103.9 -300          General: NAD Pulmonary: no increased work of breathing Abdomen: non-distended, non-tender, fundus firm at level of umbilicus Incision: D/C/I Extremities: no edema, no erythema, no tenderness  Results for orders placed or performed during the hospital encounter of 04/15/21 (from the past 72 hour(s))  Resp Panel by RT-PCR (Flu A&B, Covid) Nasopharyngeal Swab     Status: None   Collection Time: 04/15/21  1:15 PM   Specimen: Nasopharyngeal Swab; Nasopharyngeal(NP) swabs in vial transport medium  Result Value Ref Range   SARS Coronavirus 2 by RT PCR NEGATIVE NEGATIVE    Comment: (NOTE) SARS-CoV-2 target nucleic acids are NOT DETECTED.  The SARS-CoV-2 RNA is generally detectable in upper respiratory specimens during the acute phase of infection. The lowest concentration of SARS-CoV-2 viral copies this assay can detect is 138 copies/mL. A negative result does not preclude SARS-Cov-2 infection and should not be used as the sole basis for treatment or other patient management decisions. A negative result may occur with  improper specimen collection/handling, submission of specimen other than nasopharyngeal swab, presence of viral mutation(s) within the areas targeted by this assay, and inadequate number of viral copies(<138 copies/mL). A negative result must be combined with clinical  observations, patient history, and epidemiological information. The expected result is Negative.  Fact Sheet for Patients:  BloggerCourse.com  Fact Sheet for Healthcare Providers:  SeriousBroker.it  This test is no t yet approved or cleared by the [place] FDA and  has been authorized for detection and/or diagnosis of SARS-CoV-2 by FDA under an Emergency Use Authorization (EUA). This EUA will remain  in effect (meaning this test can be used) for the duration of the COVID-19 declaration under Section 564(b)(1) of the Act, 21 U.S.C.section 360bbb-3(b)(1), unless the authorization is terminated  or revoked sooner.       Influenza A by PCR NEGATIVE NEGATIVE   Influenza B by PCR NEGATIVE NEGATIVE    Comment: (NOTE) The Xpert Xpress SARS-CoV-2/FLU/RSV plus assay is intended as an aid in the diagnosis of influenza from Nasopharyngeal swab specimens and should not be used as a sole basis for treatment. Nasal washings and aspirates are unacceptable for Xpert Xpress SARS-CoV-2/FLU/RSV testing.  Fact Sheet for Patients: BloggerCourse.com  Fact Sheet for Healthcare Providers: SeriousBroker.it  This test is not yet approved or cleared by the [place] FDA and has been authorized for detection and/or diagnosis of SARS-CoV-2 by FDA under an Emergency Use Authorization (EUA). This EUA will remain in effect (meaning this test can be used) for the duration of the COVID-19 declaration under Section 564(b)(1) of the Act, 21 U.S.C. section 360bbb-3(b)(1), unless the authorization is terminated or revoked.  Performed at [facility], [address] [place], [place] [address]   CBC     Status: None   Collection Time: 04/15/21  1:34 PM  Result Value Ref Range   WBC 9.3 4.0 - 10.5 K/uL   RBC 4.81 3.87 - 5.11 MIL/uL   Hemoglobin 14.2 12.0 - 15.0 g/dL   HCT 83.3 82.5 - 05.3 %    MCV 86.9 80.0 - 100.0 fL   MCH 29.5 26.0 - 34.0 pg   MCHC 34.0 30.0 - 36.0 g/dL   RDW 97.6 73.4 - 19.3 %   Platelets 208 150 - 400 K/uL   nRBC 0.0 0.0 - 0.2 %    Comment: Performed at [facility], [address] [place], [place] [address]  Rapid HIV screen (HIV 1/2 Ab+Ag)     Status: None   Collection Time: 04/15/21  1:34 PM  Result Value Ref Range   HIV-1 P24 Antigen - HIV24 NON REACTIVE NON REACTIVE    Comment: (NOTE) Detection of p24 may be inhibited by biotin in the sample, causing false negative results in acute infection.    HIV 1/2 Antibodies NON REACTIVE NON REACTIVE   Interpretation (HIV Ag Ab)      A non reactive test result means that HIV 1 or HIV 2 antibodies and HIV 1 p24 antigen were not detected in the specimen.    Comment: Performed at [facility], [address] [place], [place] [address]  Type and screen [facility]     Status: None   Collection Time: 04/15/21  1:34 PM  Result Value Ref Range   ABO/RH(D) A POS    Antibody Screen NEG    Sample Expiration      04/18/2021,2359 Performed at [facility], [address] [place], [place] [address]   CBC     Status: Abnormal   Collection Time: 04/16/21  6:04 AM  Result Value Ref Range   WBC 11.7 (H) 4.0 - 10.5 K/uL   RBC 4.13 3.87 - 5.11 MIL/uL   Hemoglobin 12.4 12.0 - 15.0 g/dL   HCT 42.6 83.4 - 19.6 %   MCV 88.1 80.0 - 100.0 fL   MCH 30.0 26.0 - 34.0 pg   MCHC 34.1 30.0 - 36.0 g/dL   RDW 22.2 97.9 - 89.2 %   Platelets 173 150 - 400 K/uL   nRBC 0.0 0.0 - 0.2 %    Comment: Performed at [facility], [address] [place], [place] [address]    Immunization History  Administered Date(s) Administered  . Influenza,inj,Quad PF,6+ Mos 09/17/2020  . PFIZER(Purple Top)SARS-COV-2 Vaccination 07/07/2020, 07/28/2020  . Tdap 02/01/2021    Assessment:   35 y.o. [id number] postoperativeday # 1 1LTCS for Breech   Plan:  ) Acute blood loss anemia -  hemodynamically stable and asymptomatic - po ferrous sulfate  2) Blood Type --/--/A POS (05/09 1334) / Rubella 3.49 (10/11 1209) / Varicella Immune  3) TDAP status up to date  4) Feeding plan breast  5)  Education given regarding options for contraception, as well as compatibility with breast feeding if applicable.  Patient plans on IUD for contraception.  6) Disposition  [name], MD, [name], [facility] 04/16/2021, 8:53 AM

## 2021-04-16 NOTE — Lactation Note (Signed)
This note was copied from a baby's chart. Lactation Consultation Note  Patient Name: Kaitlin [name]'S Date: 04/16/2021 Reason for consult: Follow-up assessment;Mother's request;Early term 37-38.6wks;Other (Comment) (c-section; spitty) Age:35 hours  Lactation at bedside post 24hr testing and bath. Baby skin to skin with mom in modified cradle hold, mom laid back in bed attempting to feed. Baby sleepy and uninterested/content. LC adjusted position, added support pillows, and attempted to assist with feeding. As baby's position was adjusted she had clear emesis (small), gurgled, and then burped.   LC suggested putting baby back skin to skin for remainder of 1hr post bath. LC directed mom post temp check to begin pumping routine every 3 hours (minimum), and offering of feedings at the breast every 3 hours first. Any milk expressed will be given to baby via gloved finger/curved tip syringe. Dad present at bedside and verbalized understanding of plan. LC provided mom with reassurance of baby's behavior, feeding plan, and mom's previous ability to provide an adequate milk supply. LC also provided reassurance with ability to provide remainder of DBM if needed, but LC and RN's concur that baby is not symptomatic (of low sugar level) at this time.  LC also suggested that mom sit up in bed while pumping to allow gravity to help, and changing in position (more up right) and additional support with baby at the breast may encourage her to be more uncomfortable/alert. Mom remains in laid back positioning/almost lying down for both feeding attempts and position changes for football and cross-cradle. Mom has told nurse that she is in pain and was given medication.  Feeding plan: -finish skin to skin time with baby from bath and then pump. -Offer breast every 3 hours, pump post feeding or pump if baby remains sleepy (Encouraged hand expression pre and post pumping for optimal milk removal) -Offering of  any EBM via curved tip syringe -Option for donor milk if necessary  Maternal Data Has patient been taught Hand Expression?: Yes Does the patient have breastfeeding experience prior to this delivery?: Yes  Feeding Mother's Current Feeding Choice: Breast Milk  LATCH Score Latch: Too sleepy or reluctant, no latch achieved, no sucking elicited.                  Lactation Tools Discussed/Used Tools: Nipple Dorris Carnes;Pump Nipple shield size: 24 Breast pump type: Double-Electric Breast Pump Reason for Pumping: stimulation Pumping frequency: q 3 hours  Interventions Interventions: Breast feeding basics reviewed;Assisted with latch;Adjust position;Support pillows;Position options;Education  Discharge    Consult Status Consult Status: Follow-up Date: 04/17/21 Follow-up type: In-patient    [name] 04/16/2021, 4:22 PM

## 2021-04-16 NOTE — Lactation Note (Signed)
This note was copied from a baby's chart. Lactation Consultation Note  Patient Name: Kaitlin [name] [id number] Date: 04/16/2021 Reason for consult: Follow-up assessment;Early term 37-38.6wks;Other (Comment) (c-section/spitty) Age:35 hours  Lactation at bedside. Several hours since last feeding, although mom reports 2 attempts at 12:15 and 1:30pm.   LC woke baby, removed blanket and stimulated. LC encouraged mom to be more upright in bed, as she was lying flat. Mom lifted head to bed slightly, support pillows placed and LC assisted with positioning baby in laid back football hold.  Baby uninterested at the breast, began to push away, and looked like she would spit again. Once left skin to skin, baby settled quickly and fell asleep. Encouraged to keep baby skin to skin, hand expression and use of pump if baby remains sleepy. Option for bath, in hopes of waking baby/stimulating baby.  RN updated.  Maternal Data Has patient been taught Hand Expression?: Yes Does the patient have breastfeeding experience prior to this delivery?: Yes  Feeding Mother's Current Feeding Choice: Breast Milk  LATCH Score Latch: Too sleepy or reluctant, no latch achieved, no sucking elicited.                  Lactation Tools Discussed/Used Tools: Nipple Shields Nipple shield size: 24  Interventions Interventions: Breast feeding basics reviewed;Assisted with latch;Adjust position;Support pillows;Education  Discharge    Consult Status Consult Status: Follow-up Date: 04/16/21 Follow-up type: In-patient    [name] 04/16/2021, 2:57 PM

## 2021-04-16 NOTE — Progress Notes (Signed)
Mom is pumping. Baby sleeping, due to feed again at 3am or with cues. Plan to give pumped milk with donor milk if needed due to maternal exhaustion. FOB at bedside, supportive, will help mom with next feeding so she can rest.

## 2021-04-17 MED ORDER — COVID-19 MRNA VAC-TRIS(PFIZER) 30 MCG/0.3ML IM SUSP
0.3000 mL | Freq: Once | INTRAMUSCULAR | Status: AC
  Administered 2021-04-17: 0.3 mL via INTRAMUSCULAR
  Filled 2021-04-17: qty 0.3

## 2021-04-17 MED ORDER — SERTRALINE HCL 25 MG PO TABS
75.0000 mg | ORAL_TABLET | Freq: Every day | ORAL | Status: DC
  Administered 2021-04-17: 75 mg via ORAL
  Filled 2021-04-17: qty 3

## 2021-04-17 NOTE — Progress Notes (Signed)
Obstetric Postpartum/PostOperative Daily Progress Note Subjective:  35 y.o. [id number] post-operative day # 2 status post primary cesarean delivery.  She is ambulating, is tolerating po, is voiding spontaneously.  Her pain is well controlled on PO pain medications. Her lochia is less than menses. She reports a significant amount of anxiety regarding her breastfeeding experience. She is concerned about milk production and has been supplementing feeds with donor breast milk. Patient reports a history of generalized anxiety and depression and states these symptoms have been worsening since delivery.   Medications SCHEDULED MEDICATIONS  . acetaminophen  1,000 mg Oral Q6H  . COVID-19 mRNA Vac-TriS (Pf[employer])  0.3 mL Intramuscular Once  . docusate sodium  100 mg Oral BID  . ibuprofen  600 mg Oral Q6H  . prenatal multivitamin  1 tablet Oral Q1200  . scopolamine  1 patch Transdermal Once  . sertraline  75 mg Oral QHS    MEDICATION INFUSIONS  . lactated ringers Stopped (04/16/21 1031)  . naLOXone [facility]) adult infusion for PRURITIS    . Oxytocin-Sodium Chloride Stopped (04/16/21 1545)    PRN MEDICATIONS  albuterol, benzocaine-Menthol, calcium carbonate, coconut oil, cyclobenzaprine, witch hazel-glycerin **AND** dibucaine, diphenhydrAMINE, diphenhydrAMINE **OR** diphenhydrAMINE, nalbuphine **OR** nalbuphine, nalbuphine **OR** nalbuphine, naloxone **AND** sodium chloride flush, naLOXone (NARCAN) adult infusion for PRURITIS, ondansetron **OR** ondansetron (ZOFRAN) IV, ondansetron (ZOFRAN) IV, oxyCODONE, oxyCODONE, simethicone, zolpidem    Objective:   Vitals:   04/16/21 0816 04/16/21 1617 04/16/21 2307 04/17/21 0755  BP: 119/70 127/73 124/68 133/79  Pulse: 71 84 76 71  Resp: 18 20 18 20   Temp: 98.3 F (36.8 C) 98.4 F (36.9 C) 98.5 F (36.9 C) 97.9 F (36.6 C)  TempSrc: Oral  Oral Oral  SpO2: 99% 98% 99% 99%    Current Vital Signs 24h Vital Sign Ranges  T 97.9 F (36.6 C) Temp  Avg: 98.3 F  (36.8 C)  Min: 97.9 F (36.6 C)  Max: 98.5 F (36.9 C)  BP 133/79 BP  Min: 124/68  Max: 133/79  HR 71 Pulse  Avg: 77  Min: 71  Max: 84  RR 20 Resp  Avg: 19.3  Min: 18  Max: 20  SaO2 99 % Room Air SpO2  Avg: 98.7 %  Min: 98 %  Max: 99 %       24 Hour I/O Current Shift I/O  Time Ins Outs 05/10 0701 - 05/11 0700 In: 1096.9 [I.V.:1096.9] Out: 2290 [Urine:2290] No intake/output data recorded.  General: NAD Pulmonary: no increased work of breathing Abdomen: non-distended, non-tender, fundus firm at level of umbilicus Inc: Clean/dry/intact Extremities: no edema, no erythema, no tenderness  Labs:  Recent Labs  Lab 04/15/21 1334 04/16/21 0604  WBC 9.3 11.7*  HGB 14.2 12.4  HCT 41.8 36.4  PLT 208 173     Assessment:   35 y.o. G2P1102 postoperative day # 2 status post primary cesarean section, lactating, with anxiety disorder  Plan:  1) Acute blood loss anemia - hemodynamically stable and asymptomatic  2) A POS / Rubella 3.49 (10/11 1209)/ Varicella Immune  3) TDAP status UTD  4) Discussed management of anxiety and depression - patient agreeable to increasing current sertraline dose  5) breast [with donor milk supplementation] /Contraception = IUD  6) Education reviewed regarding breast milk, timeframe for establishment of milk supplement, early feeding cues, and typical newborn behavior patterns (including cluster feeding) - Encouragement provided regarding challenges of breast feeding, patient's history of successfully breast feeding her first child following preterm delivery. Plan made with  work with [facility] today.  7) Desires Covid-19 vaccination booster - ordered  8) Disposition - plan made with patient to work on breastfeeding with [facility] today, plan for discharge home tomorrow  Zipporah Plants, [place] 04/17/2021 9:56 AM

## 2021-04-17 NOTE — Lactation Note (Addendum)
This note was copied from a baby's chart. Lactation Consultation Note  Patient Name: Kaitlin [name] [id number] Date: 04/17/2021 Reason for consult: Follow-up assessment;Mother's request;Early term 37-38.6wks Age:35 hours  11:10am- LC returned to finger feed via curved tip syringe 91mL EBM. Baby tolerated feed well, and began cuing. Baby brought to breast in football hold; nipple shield applied. Baby would not attempt latch. Baby left skin to skin with mom.  [phone]) Lactation follow-up, feeding assistance/attempt, assistance with pumping.  Mom had baby skin to skin, baby appeared to be choking and then chewing on clear frothy mucous. Parents worked together to use bulb suction, kept baby upright/skin to skin, and patted on back. Baby continued to grunt, push away, and was uncomfortable.   Once calm, LC added support pillow for football hold on R breast, baby transitioned, but irritable, pushing away, and stiff legs. LC stopped feeding effort, continued to suction baby with blue bulb.  With baby skin to skin LC hand expressed from both breasts approximately 64mL. Baby swaddled- where she gagged again- LC used blue blub suction, and handed to grandmother. LC assisted with pump set-up, and turning on of pump. Instructed mom to pump every 3 hours regardless of feeding cues and feeding on demand. (Pumping scheduled written on whiteboard).  Mom verbalized feeding plan for today and when discharged: feed and pump minimum every 3 hours- with attempt to wake baby at the 2.5hr mark. Mom discussed foods and teas that helped her supply the first time: oatmeal, strawberries, and mother's milk tea- all of which she has on hand at home.   Mom plans to stay the day today to work on feedings and adjust to plan before discharge. Encouraged to call for support at each feeding, for monitoring or assistance, and to write down attempts, times, and volumes of EBM.   Maternal Data Has patient been taught Hand  Expression?: Yes Does the patient have breastfeeding experience prior to this delivery?: Yes How long did the patient breastfeed?: 80yr 29mo  Feeding Mother's Current Feeding Choice: Breast Milk  LATCH Score Latch: Too sleepy or reluctant, no latch achieved, no sucking elicited.  Audible Swallowing: None  Type of Nipple: Everted at rest and after stimulation  Comfort (Breast/Nipple): Soft / non-tender  Hold (Positioning): No assistance needed to correctly position infant at breast.  LATCH Score: 6   Lactation Tools Discussed/Used Tools: Pump;Flanges Nipple shield size: 24 Flange Size: 24 Breast pump type: Double-Electric Breast Pump Reason for Pumping: stimulation Pumping frequency: q 3 hours  Interventions Interventions: Breast feeding basics reviewed;Assisted with latch;Hand express;Adjust position;Support pillows;Education;DEBP  Discharge    Consult Status Consult Status: Follow-up Date: 04/17/21 Follow-up type: In-patient    Kaitlin [name] 04/17/2021, 10:34 AM

## 2021-04-17 NOTE — Lactation Note (Signed)
This note was copied from a baby's chart. Lactation Consultation Note  Patient Name: Kaitlin [name] [id number] Date: 04/17/2021 Reason for consult: Follow-up assessment;Mother's request;Early term 37-38.6wks;Other (Comment) (c-section/breech) Age:35 hours   Lactation at bedside- mom's request. Baby cuing, mom requesting donor milk.  LC suggested attempted feeding, and provided assistance. Cross cradle position attempted. Baby accepted breast (with shield) after a few attempts. Active for 15 minutes on R and 5 minutes on L before pulling away, some small remnants of colostrum noted in shield.  Mom reports feeling cramping in her uterus with the feeding.  55mL donor milk warmed and prepped. Mom instructed to let grandmother bottle feed while mom ate lunch, then pump for additional stimulation.  Maternal Data Has patient been taught Hand Expression?: Yes Does the patient have breastfeeding experience prior to this delivery?: Yes How long did the patient breastfeed?: 37yr 36mo  Feeding Mother's Current Feeding Choice: Breast Milk  LATCH Score Latch: Repeated attempts needed to sustain latch, nipple held in mouth throughout feeding, stimulation needed to elicit sucking reflex. (nipple shield)  Audible Swallowing: A few with stimulation  Type of Nipple: Everted at rest and after stimulation  Comfort (Breast/Nipple): Soft / non-tender  Hold (Positioning): Assistance needed to correctly position infant at breast and maintain latch.  LATCH Score: 7   Lactation Tools Discussed/Used Tools: Nipple Shields Nipple shield size: 24 Flange Size: 24 Breast pump type: Double-Electric Breast Pump Reason for Pumping: stimulation Pumping frequency: q 3 hours  Interventions Interventions: Breast feeding basics reviewed;Assisted with latch;Adjust position;Support pillows;Education  Discharge    Consult Status Consult Status: Follow-up Date: 04/17/21 Follow-up type:  In-patient    [name] 04/17/2021, 12:52 PM

## 2021-04-18 MED ORDER — OXYCODONE HCL 5 MG PO TABS: 5.0000 mg | ORAL_TABLET | Freq: Four times a day (QID) | ORAL | 0 refills | Status: AC | PRN

## 2021-04-18 NOTE — Discharge Summary (Signed)
OB Discharge Summary     Patient Name: Kaitlin [name] DOB: 02-25-1986 MRN: [id number]  Date of admission: 04/15/2021 Delivering MD: Tresea Mall, CNM  Date of Delivery: 04/18/2021  Date of discharge: 04/18/2021  Admitting diagnosis: Malpresentation before onset of labor, single or unspecified fetus [O32.9XX0] Intrauterine pregnancy: [redacted]w[redacted]d     Secondary diagnosis: None     Discharge diagnosis: Term Pregnancy Delivered                                                                                                Post partum procedures: none  Augmentation: N/A  Complications: None  Hospital course:  Onset of Labor With Unplanned C/S   35 y.o. yo [id number] at [redacted]w[redacted]d was admitted in Active Labor on 04/15/2021. The patient went for cesarean section due to Malpresentation. Delivery details as follows: Membrane Rupture Time/Date: 2:59 PM ,04/15/2021   Delivery Method:C-Section, Low Transverse  Details of operation can be found in separate operative note. Patient had an uncomplicated postpartum course.  She is ambulating,tolerating a regular diet, passing flatus, and urinating well.  Patient is discharged home in stable condition 04/18/21.  Newborn Data: Birth date:04/15/2021  Birth time:3:00 PM  Gender:Female  Living status:Living  Apgars:8 ,9  Weight:3450 g   Physical exam  Vitals:   04/17/21 0755 04/17/21 1642 04/17/21 2259 04/18/21 0828  BP: 133/79 (!) 156/82 139/77 (!) 143/86  Pulse: 71 77 72 74  Resp: 20 18 18 20   Temp: 97.9 F (36.6 C) 98.3 F (36.8 C) 98.1 F (36.7 C) 97.9 F (36.6 C)  TempSrc: Oral Oral Oral Oral  SpO2: 99% 99% 100% 99%   General: alert, cooperative and no distress, anxious Lochia: appropriate Uterine Fundus: firm Incision: Healing well with no significant drainage, On Q pump with some sero-sanguinous drainage DVT Evaluation: No evidence of DVT seen on physical exam.  Labs: Lab Results  Component Value Date   WBC 11.7 (H) 04/16/2021   HGB 12.4  04/16/2021   HCT 36.4 04/16/2021   MCV 88.1 04/16/2021   PLT 173 04/16/2021    Discharge instruction: per After Visit Summary.  Medications:  Allergies as of 04/18/2021      Reactions   Chocolate Flavor Rash   Sulfa Antibiotics Shortness Of Breath   Sulfamethoxazole-trimethoprim Shortness Of Breath   Latex Hives   Metronidazole Hives      Medication List    STOP taking these medications   albuterol 108 (90 Base) MCG/ACT inhaler Commonly known as: Ventolin HFA   cyclobenzaprine 10 MG tablet Commonly known as: FLEXERIL   diphenhydramine-acetaminophen 25-500 MG Tabs tablet Commonly known as: TYLENOL PM   OVER THE COUNTER MEDICATION     TAKE these medications   acetaminophen 500 MG tablet Commonly known as: TYLENOL Take 500 mg by mouth every 6 (six) hours as needed for mild pain or headache.   CRANBERRY PLUS VITAMIN C PO Take 2 tablets by mouth daily.   oxyCODONE 5 MG immediate release tablet Commonly known as: Oxy IR/ROXICODONE Take 1 tablet (5 mg total) by mouth every 6 (six) hours as needed for up to 5  days for severe pain (pain scale 4-7).   prenatal vitamin w/FE, FA 27-1 MG Tabs tablet Take 1 tablet by mouth daily at 12 noon.   PROBIOTIC PO Take 1 tablet by mouth daily.   sertraline 50 MG tablet Commonly known as: ZOLOFT TAKE 1 TABLET BY MOUTH EVERY DAY What changed: when to take this   Vitamin D3 125 MCG (5000 UT) Tabs Take 5,000 Units by mouth daily.            Discharge Care Instructions  (From admission, onward)         Start     Ordered   04/18/21 0000  Discharge wound care:       Comments: Keep incision dry, clean.   04/18/21 0848          Diet: routine diet  Activity: Advance as tolerated. Pelvic rest for 6 weeks.   Outpatient follow up:  Follow-up Information    Schuman, Jaquelyn Bitter, MD. Go in 1 week(s).   Specialty: Obstetrics and Gynecology Why: incision check, mood check Contact information: [address] [place] [address] [phone]                 Postpartum contraception: IUD Mirena Rhogam Given postpartum: NA Rubella vaccine given postpartum: immune Varicella vaccine given postpartum: immune TDaP given antepartum or postpartum: given antepartum   Newborn Delivery   Birth date/time: 04/15/2021 15:00:00 Delivery type: C-Section, Low Transverse Trial of labor: No C-section categorization: Primary       Baby Feeding: Breast and supplementing with formula  Disposition:home with mother  SIGNED:  Tresea Mall, CNM 04/18/2021 8:50 AM

## 2021-04-18 NOTE — Progress Notes (Signed)
Mother discharged.  Discharge instructions given.  On-Q pump removed per patient request.  [facility] is aware. Mother verbalizes understanding.  Transported by auxiliary.

## 2021-04-18 NOTE — Lactation Note (Signed)
This note was copied from a baby's chart. Lactation Consultation Note  Patient Name: Kaitlin [name] KPTWS'F Date: 04/18/2021 Reason for consult: Follow-up assessment;Early term 37-38.6wks;Other (Comment) (c-section/breech) Age:35 hours  Lactation follow-up for anticipated discharge.  Baby continued to feed at the breast overnight, cluster feeding reported by mom between 2am-4am, some donor milk used.  Mom is confident in feeding plan once discharged: Feed on demand at breast- goal of every 3 hours Hand expression/manual/DEBP post feedings and between feedings as needed. Mom has started foods/beverages that helped her increase her milk supply with the first child: oatmeal, strawberries, and mother's milk tea.  LC agreed with mom on her feeding plan and efforts. Information given for outpatient lactation services, encouraged tracking of output, gave information on when to call MD. Reviewed breast and nipple care for mom, breast fullness and engorgement and management of both- signs of when to seek MD care.  Mom reports having a peds follow-up appointment tomorrow for weight check.   Encouraged to call with questions and for ongoing breastfeeding support as needed.  Maternal Data Has patient been taught Hand Expression?: Yes Does the patient have breastfeeding experience prior to this delivery?: Yes How long did the patient breastfeed?: 2year 10months  Feeding Mother's Current Feeding Choice: Breast Milk  LATCH Score                    Lactation Tools Discussed/Used Tools: Pump;Coconut oil;Nipple Shields Nipple shield size: 24 Flange Size: 24;27 Breast pump type: Double-Electric Breast Pump Reason for Pumping: stimulation/supplement Pumping frequency: Q 3 hours/post feeds  Interventions Interventions: Breast feeding basics reviewed;DEBP;Education  Discharge Discharge Education: Engorgement and breast care;Warning signs for feeding baby;Outpatient  recommendation Pump: Personal;DEBP  Consult Status Consult Status: Complete Date: 04/18/21 Follow-up type: Call as needed    [name] 04/18/2021, 9:13 AM

## 2021-04-19 ENCOUNTER — Other Ambulatory Visit: Admission: RE | Admit: 2021-04-19 | Payer: BC Managed Care – PPO | Source: Ambulatory Visit

## 2021-04-22 ENCOUNTER — Inpatient Hospital Stay
Admission: RE | Admit: 2021-04-22 | Payer: BC Managed Care – PPO | Source: Home / Self Care | Admitting: Obstetrics and Gynecology

## 2021-04-26 ENCOUNTER — Encounter: Payer: Self-pay | Admitting: Obstetrics and Gynecology

## 2021-04-26 ENCOUNTER — Other Ambulatory Visit: Payer: Self-pay

## 2021-04-26 ENCOUNTER — Ambulatory Visit (INDEPENDENT_AMBULATORY_CARE_PROVIDER_SITE_OTHER): Payer: BC Managed Care – PPO | Admitting: Obstetrics and Gynecology

## 2021-04-26 NOTE — Progress Notes (Signed)
Postpartum Visit  Chief Complaint:  Chief Complaint  Patient presents with  . Postpartum Care    History of Present Illness: Patient is a 35 y.o. Kaitlin [name] presents for postpartum visit.  Date of delivery: 04/15/2021 Type of delivery: cesarean section  Breast Feeding:  yes Lochia: normal Post partum depression/anxiety noted:  yes Edinburgh Post-Partum Depression Score:  16  Date of last PAP: 2021 NIL   Newborn Details:  SINGLETON :  1. Baby Gender:female.  Infant Status: Infant doing well at home with mother.   Review of Systems: Review of Systems  Constitutional: Negative for chills, fever, malaise/fatigue and weight loss.  HENT: Negative for congestion, hearing loss and sinus pain.   Eyes: Negative for blurred vision and double vision.  Respiratory: Negative for cough, sputum production, shortness of breath and wheezing.   Cardiovascular: Negative for chest pain, palpitations, orthopnea and leg swelling.  Gastrointestinal: Negative for abdominal pain, constipation, diarrhea, nausea and vomiting.  Genitourinary: Negative for dysuria, flank pain, frequency, hematuria and urgency.  Musculoskeletal: Negative for back pain, falls and joint pain.  Skin: Negative for itching and rash.  Neurological: Negative for dizziness and headaches.  Psychiatric/Behavioral: Negative for depression, substance abuse and suicidal ideas. The patient is not nervous/anxious.      Past Medical History:  Past Medical History:  Diagnosis Date  . Anxiety and depression   . Asthma   . Eczema   . Interstitial cystitis   . PCOS (polycystic ovarian syndrome)   . Premature labor before 37 weeks of gestation with delivery, delivered    PPROM at 34 weeks    Past Surgical History:  Past Surgical History:  Procedure Laterality Date  . CESAREAN SECTION N/A 04/15/2021   Procedure: CESAREAN SECTION;  Surgeon: Natale Milch, MD;  Location: [facility] ORS;  Service: Obstetrics;  Laterality: N/A;  .  Excision of lymph node  02/24/2013   sublingual lymph node: Dr Gertie Baron  . INTRAUTERINE DEVICE INSERTION  06/01/2017   [facility]  . WISDOM TOOTH EXTRACTION      Family History:  Family History  Problem Relation Age of Onset  . Multiple sclerosis Mother   . Breast cancer Maternal Grandmother        73s  . Hypertension Maternal Grandfather   . Thyroid cancer Maternal Grandfather   . Lung cancer Paternal Grandmother   . Hypertension Paternal Grandmother   . Diabetes Mellitus II Paternal Grandfather   . Hypertension Paternal Grandfather     Social History:  Social History   Socioeconomic History  . Marital status: Married    Spouse name: Not on file  . Number of children: 1  . Years of education: Not on file  . Highest education level: Not on file  Occupational History  . Not on file  Tobacco Use  . Smoking status: Former Smoker    Quit date: 2009    Years since quitting: 13.3  . Smokeless tobacco: Never Used  Vaping Use  . Vaping Use: Never used  Substance and Sexual Activity  . Alcohol use: No  . Drug use: No  . Sexual activity: Yes    Partners: Male    Birth control/protection: I.U.D.  Other Topics Concern  . Not on file  Social History Narrative  . Not on file   Social Determinants of Health   Financial Resource Strain: Not on file  Food Insecurity: Not on file  Transportation Needs: Not on file  Physical Activity: Not on file  Stress: Not on  file  Social Connections: Not on file  Intimate Partner Violence: Not on file    Allergies:  Allergies  Allergen Reactions  . Chocolate Flavor Rash  . Sulfa Antibiotics Shortness Of Breath  . Sulfamethoxazole-Trimethoprim Shortness Of Breath  . Latex Hives  . Metronidazole Hives    Medications: Prior to Admission medications   Medication Sig Start Date End Date Taking? Authorizing Provider  acetaminophen (TYLENOL) 500 MG tablet Take 500 mg by mouth every 6 (six) hours as needed for mild pain or  headache.   Yes [provider]  Cholecalciferol (VITAMIN D3) 125 MCG (5000 UT) TABS Take 5,000 Units by mouth daily. 02/14/11  Yes [provider]  Cranberry-Vitamin C-Vitamin E (CRANBERRY PLUS VITAMIN C PO) Take 2 tablets by mouth daily.   Yes [provider]  prenatal vitamin w/FE, FA (PRENATAL 1 + 1) 27-1 MG TABS tablet Take 1 tablet by mouth daily at 12 noon.   Yes [provider]  Probiotic Product (PROBIOTIC PO) Take 1 tablet by mouth daily.   Yes [provider]  sertraline (ZOLOFT) 50 MG tablet TAKE 1 TABLET BY MOUTH EVERY DAY Patient taking differently: Take 50 mg by mouth at bedtime. 02/25/21  Yes [name], MD    Physical Exam Vitals:  Vitals:   04/26/21 1127  BP: 124/72    Physical Exam Constitutional:      Appearance: Normal appearance. She is well-developed.  HENT:     Head: Normocephalic and atraumatic.  Eyes:     Extraocular Movements: Extraocular movements intact.     Pupils: Pupils are equal, round, and reactive to light.  Neck:     Thyroid: No thyromegaly.  Cardiovascular:     Rate and Rhythm: Normal rate and regular rhythm.     Heart sounds: Normal heart sounds.  Pulmonary:     Effort: Pulmonary effort is normal.     Breath sounds: Normal breath sounds.  Abdominal:     General: Bowel sounds are normal. There is no distension.     Palpations: Abdomen is soft. There is no mass.     Comments: Incisions in clean, dry, and intact  Musculoskeletal:     Cervical back: Neck supple.  Neurological:     Mental Status: She is alert and oriented to person, place, and time.  Skin:    General: Skin is warm and dry.  Psychiatric:        Behavior: Behavior normal.        Thought Content: Thought content normal.        Judgment: Judgment normal.  Vitals reviewed.     Assessment: 35 y.o. [id number] presenting for 6 week postpartum visit  Plan: Problem List Items Addressed This Visit      Other   Postpartum care  following cesarean delivery - Primary       1) Contraception-  Desires IUD placment  2)  Pap up to date.  3) Patient underwent screening for postpartum depression with some concerns noted. She has managed depression for a long time. She increased her zoloft dose from 50-75 mg when leaving the hospital. She is see a therapist regularly. We discussed Zulresso infusion as an additional treatment option. We also discussed increasing her zoloft dose if needed to 100 mg a day. She also utilizes an alpha wave device.     - Follow up in 4 weeks Adelene Idler MD, [name], [facility] 04/26/2021 12:24 PM

## 2021-05-27 ENCOUNTER — Encounter: Payer: Self-pay | Admitting: Obstetrics and Gynecology

## 2021-05-27 ENCOUNTER — Other Ambulatory Visit: Payer: Self-pay

## 2021-05-27 ENCOUNTER — Ambulatory Visit (INDEPENDENT_AMBULATORY_CARE_PROVIDER_SITE_OTHER): Payer: BC Managed Care – PPO | Admitting: Obstetrics and Gynecology

## 2021-05-27 DIAGNOSIS — Z30430 Encounter for insertion of intrauterine contraceptive device: Secondary | ICD-10-CM

## 2021-05-27 NOTE — Progress Notes (Signed)
Postpartum Visit  Chief Complaint:  Chief Complaint  Patient presents with   Post-op Follow-up    6 wk postpartum - IUD (Mirena), no concerns. RM 4    History of Present Illness: Patient is a 35 y.o. [id number] presents for postpartum visit.  Date of delivery: 04/15/2021 Cesarean Section: Elective repeat Pregnancy or labor problems:  no Any problems since the delivery:  no  Newborn Details:  SINGLETON :  1. BabyGender female. Birth weight: 7lbs 10oz Maternal Details:  Breast or formula feeding: plans to breastfeed Intercourse: No  Contraception after delivery: No  Any bowel or bladder issues: No  Post partum depression/anxiety noted:  yes Edinburgh Post-Partum Depression Score:6 Date of last PAP: 10/11/2021NIL and HR HPV negative   Review of Systems: Review of Systems  Constitutional: Negative.   Gastrointestinal: Negative.   Genitourinary: Negative.   Psychiatric/Behavioral: Negative.     The following portions of the patient's history were reviewed and updated as appropriate: allergies, current medications, past family history, past medical history, past social history, past surgical history, and problem list.  Past Medical History:  Past Medical History:  Diagnosis Date   Anxiety and depression    Asthma    Eczema    Interstitial cystitis    PCOS (polycystic ovarian syndrome)    Premature labor before 37 weeks of gestation with delivery, delivered    PPROM at 34 weeks    Past Surgical History:  Past Surgical History:  Procedure Laterality Date   CESAREAN SECTION N/A 04/15/2021   Procedure: CESAREAN SECTION;  Surgeon: Natale Milch, MD;  Location: [facility] ORS;  Service: Obstetrics;  Laterality: N/A;   Excision of lymph node  02/24/2013   sublingual lymph node: Dr Gertie Baron   INTRAUTERINE DEVICE INSERTION  06/01/2017   [facility]   WISDOM TOOTH EXTRACTION      Family History:  Family History  Problem Relation Age of Onset   Multiple sclerosis  Mother    Breast cancer Maternal Grandmother        59s   Hypertension Maternal Grandfather    Thyroid cancer Maternal Grandfather    Lung cancer Paternal Grandmother    Hypertension Paternal Grandmother    Diabetes Mellitus II Paternal Grandfather    Hypertension Paternal Grandfather     Social History:  Social History   Socioeconomic History   Marital status: Married    Spouse name: Not on file   Number of children: 1   Years of education: Not on file   Highest education level: Not on file  Occupational History   Not on file  Tobacco Use   Smoking status: Former    Pack years: 0.00    Types: Cigarettes    Quit date: 2009    Years since quitting: 13.4   Smokeless tobacco: Never  Vaping Use   Vaping Use: Never used  Substance and Sexual Activity   Alcohol use: No   Drug use: No   Sexual activity: Yes    Partners: Male    Birth control/protection: I.U.D.  Other Topics Concern   Not on file  Social History Narrative   Not on file   Social Determinants of Health   Financial Resource Strain: Not on file  Food Insecurity: Not on file  Transportation Needs: Not on file  Physical Activity: Not on file  Stress: Not on file  Social Connections: Not on file  Intimate Partner Violence: Not on file    Allergies:  Allergies  Allergen  Reactions   Chocolate Flavor Rash   Sulfa Antibiotics Shortness Of Breath   Sulfamethoxazole-Trimethoprim Shortness Of Breath   Latex Hives   Metronidazole Hives    Medications: Prior to Admission medications   Medication Sig Start Date End Date Taking? Authorizing Provider  acetaminophen (TYLENOL) 500 MG tablet Take 500 mg by mouth every 6 (six) hours as needed for mild pain or headache.    [provider]  Cholecalciferol (VITAMIN D3) 125 MCG (5000 UT) TABS Take 5,000 Units by mouth daily. 02/14/11   [provider]  Cranberry-Vitamin C-Vitamin E (CRANBERRY PLUS VITAMIN C PO) Take 2 tablets by mouth daily.     [provider]  prenatal vitamin w/FE, FA (PRENATAL 1 + 1) 27-1 MG TABS tablet Take 1 tablet by mouth daily at 12 noon.    [provider]  Probiotic Product (PROBIOTIC PO) Take 1 tablet by mouth daily.    [provider]  sertraline (ZOLOFT) 50 MG tablet TAKE 1 TABLET BY MOUTH EVERY DAY Patient taking differently: Take 50 mg by mouth at bedtime. 02/25/21   [name], MD    Physical Exam Blood pressure 120/74, height 5\' 3"  (1.6 m), weight 173 lb (78.5 kg), last menstrual period 07/20/2020, currently breastfeeding.  General: NAD HEENT: normocephalic, anicteric Pulmonary: No increased work of breathing Abdomen: NABS, soft, non-tender, non-distended.  Umbilicus without lesions.  No hepatomegaly, splenomegaly or masses palpable. No evidence of hernia. Incision D/C/I Genitourinary:  External: Normal external female genitalia.  Normal urethral meatus, normal  Bartholin's and Skene's glands.    Vagina: Normal vaginal mucosa, no evidence of prolapse.    Cervix: Grossly normal in appearance, no bleeding  Uterus: Non-enlarged, mobile, normal contour.  No CMT  Adnexa: ovaries non-enlarged, no adnexal masses  Rectal: deferred Extremities: no edema, erythema, or tenderness Neurologic: Grossly intact Psychiatric: mood appropriate, affect full    GYNECOLOGY OFFICE PROCEDURE NOTE  KENLEI SAFI is a 34 y.o. 20 here for a Mirena IUD insertion. No GYN concerns.  Last pap smear was on 09/17/2020 and was normal.  The patient is currently using postpartum breast feeding for contraception and her LMP is Patient's last menstrual period was 07/20/2020..  The indication for her IUD is contraception/cycle control.  IUD Insertion Procedure Note Patient identified, informed consent performed, consent signed.   Discussed risks of irregular bleeding, cramping, infection, malpositioning, expulsion or uterine perforation of the IUD (1:1000 placements)  which may require  further procedure such as laparoscopy.  IUD while effective at preventing pregnancy do not prevent transmission of sexually transmitted diseases and use of barrier methods for this purpose was discussed. Time out was performed.  Urine pregnancy test negative.  Speculum placed in the vagina.  Cervix visualized.  Cleaned with Betadine x 2.  Grasped anteriorly with a single tooth tenaculum.  Uterus sounded to 8cm. IUD placed per manufacturer's recommendations.  Strings trimmed to 3 cm. Tenaculum was removed, good hemostasis noted.  Patient tolerated procedure well.   Patient was given post-procedure instructions.  She was advised to have backup contraception for one week.  Patient was also asked to check IUD strings periodically and follow up in 6 weeks for IUD check.   Edinburgh Postnatal Depression Scale - 05/27/21 1502       Edinburgh Postnatal Depression Scale:  In the Past 7 Days   I have been able to laugh and see the funny side of things. 0    I have looked forward with enjoyment to things. 0  I have blamed myself unnecessarily when things went wrong. 2    I have been anxious or worried for no good reason. 1    I have felt scared or panicky for no good reason. 2    Things have been getting on top of me. 1    I have been so unhappy that I have had difficulty sleeping. 0    I have felt sad or miserable. 0    I have been so unhappy that I have been crying. 0    The thought of harming myself has occurred to me. 0    Edinburgh Postnatal Depression Scale Total 6              Assessment: 35 y.o. [id number] presenting for 6 week postpartum visit  Plan: Problem List Items Addressed This Visit   None Visit Diagnoses     6 weeks postpartum follow-up    -  Primary   IUD check up           1) Contraception - Education given regarding options for contraception, as well as compatibility with breast feeding if applicable.  Patient plans on IUD for contraception.  2)  Pap - ASCCP  guidelines and rational discussed.  ASCCP guidelines and rational discussed.  Patient opts for every 3 years screening interval  3) Patient underwent screening for postpartum depression with no signs of depression  4) Return in about 6 weeks (around 07/08/2021) for IUD string check.   [name], MD, [name], [facility] 05/27/2021, 2:34 PM

## 2021-05-30 DIAGNOSIS — Z30431 Encounter for routine checking of intrauterine contraceptive device: Secondary | ICD-10-CM | POA: Insufficient documentation

## 2021-05-30 MED ORDER — SERTRALINE HCL 50 MG PO TABS: 50.0000 mg | ORAL_TABLET | Freq: Every day | ORAL | 3 refills | Status: DC

## 2021-06-08 NOTE — Telephone Encounter (Signed)
I can't pull a PDF to edit off the mychart if we can get whoever is doing the FMLA's to fill it out and then just stamp it it

## 2021-06-13 NOTE — Telephone Encounter (Signed)
Due to AMS out of the office this week, JEG signed FMLA papers needed.

## 2021-07-08 ENCOUNTER — Ambulatory Visit: Payer: BC Managed Care – PPO | Admitting: Obstetrics and Gynecology

## 2021-07-15 ENCOUNTER — Other Ambulatory Visit: Payer: Self-pay | Admitting: Obstetrics and Gynecology

## 2021-07-15 MED ORDER — SERTRALINE HCL 100 MG PO TABS: 150.0000 mg | ORAL_TABLET | Freq: Every day | ORAL | 3 refills | Status: DC

## 2021-08-05 ENCOUNTER — Encounter: Payer: Self-pay | Admitting: Obstetrics and Gynecology

## 2021-08-05 ENCOUNTER — Ambulatory Visit (INDEPENDENT_AMBULATORY_CARE_PROVIDER_SITE_OTHER): Payer: BC Managed Care – PPO | Admitting: Obstetrics and Gynecology

## 2021-08-05 ENCOUNTER — Other Ambulatory Visit: Payer: Self-pay

## 2021-08-05 VITALS — BP 120/72 | Ht 63.0 in | Wt 171.0 lb

## 2021-08-05 DIAGNOSIS — Z30431 Encounter for routine checking of intrauterine contraceptive device: Secondary | ICD-10-CM | POA: Diagnosis not present

## 2021-08-05 NOTE — Progress Notes (Signed)
Obstetrics & Gynecology Office Visit   Chief Complaint:  Chief Complaint  Patient presents with   Follow-up    IUD string check - no concerns. RM 4    History of Present Illness: 35 y.o. patient presenting for follow up of Mirena IUD placement 6+ weeks ago.  The indication for her IUD was contraception.  She denies any complications since her IUD placement.  Still having some occasional spotting.  is able to feel strings.    Review of Systems: Review of Systems  Constitutional: Negative.   Gastrointestinal: Negative.   Genitourinary: Negative.    Past Medical History:  Past Medical History:  Diagnosis Date   Anxiety and depression    Asthma    Eczema    Interstitial cystitis    PCOS (polycystic ovarian syndrome)    Premature labor before 37 weeks of gestation with delivery, delivered    PPROM at 34 weeks    Past Surgical History:  Past Surgical History:  Procedure Laterality Date   CESAREAN SECTION N/A 04/15/2021   Procedure: CESAREAN SECTION;  Surgeon: Natale Milch, MD;  Location: [facility] ORS;  Service: Obstetrics;  Laterality: N/A;   Excision of lymph node  02/24/2013   sublingual lymph node: Dr Gertie Baron   INTRAUTERINE DEVICE INSERTION  06/01/2017   [facility]   WISDOM TOOTH EXTRACTION      Gynecologic History: No LMP recorded. (Menstrual status: IUD).  Obstetric History: [id number]  Family History:  Family History  Problem Relation Age of Onset   Multiple sclerosis Mother    Breast cancer Maternal Grandmother        46s   Hypertension Maternal Grandfather    Thyroid cancer Maternal Grandfather    Lung cancer Paternal Grandmother    Hypertension Paternal Grandmother    Diabetes Mellitus II Paternal Grandfather    Hypertension Paternal Grandfather     Social History:  Social History   Socioeconomic History   Marital status: Married    Spouse name: Not on file   Number of children: 1   Years of education: Not on file   Highest education  level: Not on file  Occupational History   Not on file  Tobacco Use   Smoking status: Former    Types: Cigarettes    Quit date: 2009    Years since quitting: 13.6   Smokeless tobacco: Never  Vaping Use   Vaping Use: Never used  Substance and Sexual Activity   Alcohol use: No   Drug use: No   Sexual activity: Yes    Partners: Male    Birth control/protection: I.U.D.  Other Topics Concern   Not on file  Social History Narrative   Not on file   Social Determinants of Health   Financial Resource Strain: Not on file  Food Insecurity: Not on file  Transportation Needs: Not on file  Physical Activity: Not on file  Stress: Not on file  Social Connections: Not on file  Intimate Partner Violence: Not on file    Allergies:  Allergies  Allergen Reactions   Chocolate Flavor Rash   Sulfa Antibiotics Shortness Of Breath   Sulfamethoxazole-Trimethoprim Shortness Of Breath   Latex Hives   Metronidazole Hives    Medications: Prior to Admission medications   Medication Sig Start Date End Date Taking? Authorizing Provider  Cholecalciferol (VITAMIN D3) 125 MCG (5000 UT) TABS Take 5,000 Units by mouth daily. 02/14/11  Yes [provider]  Cranberry-Vitamin C-Vitamin E (CRANBERRY PLUS VITAMIN C  PO) Take 2 tablets by mouth daily.   Yes [provider]  prenatal vitamin w/FE, FA (PRENATAL 1 + 1) 27-1 MG TABS tablet Take 1 tablet by mouth daily at 12 noon.   Yes [provider]  Probiotic Product (PROBIOTIC PO) Take 1 tablet by mouth daily.   Yes [provider]  sertraline (ZOLOFT) 100 MG tablet Take 1.5 tablets (150 mg total) by mouth at bedtime for 120 doses. 07/15/21 11/12/21 Yes [name], MD  acetaminophen (TYLENOL) 500 MG tablet Take 500 mg by mouth every 6 (six) hours as needed for mild pain or headache.    [provider]    Physical Exam Blood pressure 120/72, height 5\' 3"  (1.6 m), weight 171 lb (77.6 kg), currently  breastfeeding. No LMP recorded. (Menstrual status: IUD).  General: NAD HEENT: normocephalic, anicteric Pulmonary: No increased work of breathing Genitourinary:  External: Normal external female genitalia.  Normal urethral meatus, normal  Bartholin's and Skene's glands.    Vagina: Normal vaginal mucosa, no evidence of prolapse.    Cervix: Grossly normal in appearance, no bleeding, IUD strings visualized 2cm  Uterus: Non-enlarged, mobile, normal contour.  No CMT  Adnexa: ovaries non-enlarged, no adnexal masses  Rectal: deferred  Lymphatic: no evidence of inguinal lymphadenopathy Extremities: no edema, erythema, or tenderness Neurologic: Grossly intact Psychiatric: mood appropriate, affect full  Female chaperone present for pelvic and breast  portions of the physical exam  Assessment: 35 y.o. 20 IUD check up  Plan: Problem List Items Addressed This Visit   None    1.  The patient was given instructions to check her IUD strings monthly and call with any problems or concerns.  She should call for fevers, chills, abnormal vaginal discharge, pelvic pain, or other complaints.  2.   IUDs while effective at preventing pregnancy do not prevent transmission of sexually transmitted diseases and use of barrier methods for this purpose was discussed.  Low overall incidence of failure with 99.7% efficacy rate in typical use.  The patient has not contraindication to IUD placement.  3.  She will return for a annual exam in 1 year.  All questions answered.  4) A total of 15 minutes were spent in face-to-face contact with the patient during this encounter with over half of that time devoted to counseling and coordination of care.  5) No follow-ups on file.   [id number], MD, [name] [facility], [facility] 08/05/2021, 9:54 AM

## 2021-08-13 ENCOUNTER — Ambulatory Visit (INDEPENDENT_AMBULATORY_CARE_PROVIDER_SITE_OTHER): Payer: BC Managed Care – PPO | Admitting: Podiatry

## 2021-08-13 ENCOUNTER — Other Ambulatory Visit: Payer: Self-pay

## 2021-08-13 ENCOUNTER — Encounter: Payer: Self-pay | Admitting: Podiatry

## 2021-08-13 DIAGNOSIS — L600 Ingrowing nail: Secondary | ICD-10-CM

## 2021-08-13 NOTE — Progress Notes (Signed)
   HPI: 35 y.o. female presenting today for evaluation of pain and tenderness to the lateral aspect of the right great toe.  Patient states that over the past month she stubbed her toe against her husband's shoe and she has had some pain and tenderness to the area ever since.  She does admit to some steady improvement over the past 4 weeks however is still slightly tender.  She would like to have it evaluated.  She does have history of ingrown toenails and nail matricectomy was performed to the medial borders of the bilateral great toes.  She presents for further treatment evaluation  Past Medical History:  Diagnosis Date   Anxiety and depression    Asthma    Eczema    Interstitial cystitis    PCOS (polycystic ovarian syndrome)    Premature labor before 37 weeks of gestation with delivery, delivered    PPROM at 34 weeks     Physical Exam: General: The patient is alert and oriented x3 in no acute distress.  Dermatology: Skin is warm, dry and supple bilateral lower extremities. Negative for open lesions or macerations.  There is a small area and corner of the nail plate to the lateral border of the right hallux that does appear to be intruding into the periungual skin.  Associated tenderness in this area as well  Vascular: Palpable pedal pulses bilaterally. No edema or erythema noted. Capillary refill within normal limits.  Neurological: Epicritic and protective threshold grossly intact bilaterally.   Musculoskeletal Exam: No pedal deformities    Assessment: 1.  Mild ingrowing toenail right hallux lateral border   Plan of Care:  1. Patient evaluated.  2.  Presented the different options for the patient regarding care.  Recommended simple debridement and conservative treatment over partial nail matricectomy.  The patient agrees.  Light debridement of the area was performed using a nail nipper without incident or bleeding.  The patient felt relief 3.  Return to clinic as needed       Felecia Shelling, DPM [facility]  Dr. Felecia Shelling, DPM    [address] [place], [place] [address]                Office [phone]  Fax [phone]

## 2022-06-30 ENCOUNTER — Encounter: Payer: Self-pay | Admitting: Licensed Practical Nurse

## 2022-06-30 ENCOUNTER — Ambulatory Visit: Payer: BC Managed Care – PPO | Admitting: Licensed Practical Nurse

## 2022-06-30 VITALS — BP 120/80 | Ht 63.0 in | Wt 173.0 lb

## 2022-06-30 DIAGNOSIS — Z30431 Encounter for routine checking of intrauterine contraceptive device: Secondary | ICD-10-CM

## 2022-06-30 DIAGNOSIS — L689 Hypertrichosis, unspecified: Secondary | ICD-10-CM

## 2022-06-30 DIAGNOSIS — R5383 Other fatigue: Secondary | ICD-10-CM

## 2022-06-30 DIAGNOSIS — R6889 Other general symptoms and signs: Secondary | ICD-10-CM | POA: Diagnosis not present

## 2022-06-30 NOTE — Progress Notes (Unsigned)
Obstetrics & Gynecology Office Visit   Chief Complaint:  Chief Complaint  Patient presents with   Contraception    Iud check,     History of Present Illness: Here for IUD check, has had Mirena IUD since 07/2021, recently her husband has been poked by the strings. She has never checked her strings so is unsure about there length. This has happened I the past with another iUD, that IUD ended  up expelling itself.   Olina also mentioned over the last few months her cycles have been "random:, after the IUD was inserted she did have some bleeding, but by November her bleeding stopped.  Her LMP is June with a light flow.  Salina also reports she has more facial hair than usual, the hair on her upper lip is more course and plentiful, and she is seeing more hair on her chin.  She hs had low energy since March, she has been feeling cold even when wearing sweat clothes. She does have thyroid nodules, her last [place] was "a few years ago", she is not on Thyroid medication.  She does have an endocrinologist, that she has not seen in years. She was told she has PCOS.   Review of Systems: irregular cycles, facial hair, cold intolerance, fatigue   Past Medical History:  Past Medical History:  Diagnosis Date   Anxiety and depression    Asthma    Eczema    Interstitial cystitis    PCOS (polycystic ovarian syndrome)    Premature labor before 37 weeks of gestation with delivery, delivered    PPROM at 34 weeks    Past Surgical History:  Past Surgical History:  Procedure Laterality Date   CESAREAN SECTION N/A 04/15/2021   Procedure: CESAREAN SECTION;  Surgeon: Natale Milch, MD;  Location: [facility];  Service: Obstetrics;  Laterality: N/A;   Excision of lymph node  02/24/2013   sublingual lymph node: Dr Gertie Baron   INTRAUTERINE DEVICE INSERTION  06/01/2017   [facility]   WISDOM TOOTH EXTRACTION      Gynecologic History: No LMP recorded. (Menstrual status: IUD).  Obstetric  History: [id number]  Family History:  Family History  Problem Relation Age of Onset   Multiple sclerosis Mother    Breast cancer Maternal Grandmother        51s   Hypertension Maternal Grandfather    Thyroid cancer Maternal Grandfather    Lung cancer Paternal Grandmother    Hypertension Paternal Grandmother    Diabetes Mellitus II Paternal Grandfather    Hypertension Paternal Grandfather     Social History:  Social History   Socioeconomic History   Marital status: Married    Spouse name: Not on file   Number of children: 1   Years of education: Not on file   Highest education level: Not on file  Occupational History   Not on file  Tobacco Use   Smoking status: Former    Types: Cigarettes    Quit date: 2009    Years since quitting: 14.5   Smokeless tobacco: Never  Vaping Use   Vaping Use: Never used  Substance and Sexual Activity   Alcohol use: No   Drug use: No   Sexual activity: Yes    Partners: Male    Birth control/protection: I.U.D.  Other Topics Concern   Not on file  Social History Narrative   Not on file   Social Determinants of Health   Financial Resource Strain: Not on file  Food  Insecurity: Not on file  Transportation Needs: Not on file  Physical Activity: Not on file  Stress: Not on file  Social Connections: Not on file  Intimate Partner Violence: Not on file    Allergies:  Allergies  Allergen Reactions   Chocolate Flavor Rash   Sulfa Antibiotics Shortness Of Breath   Sulfamethoxazole-Trimethoprim Shortness Of Breath   Latex Hives   Metronidazole Hives    Medications: Prior to Admission medications   Medication Sig Start Date End Date Taking? Authorizing Provider  acetaminophen (TYLENOL) 500 MG tablet Take 500 mg by mouth every 6 (six) hours as needed for mild pain or headache.    [provider]  albuterol (VENTOLIN HFA) 108 (90 Base) MCG/ACT inhaler SMARTSIG:2 Inhalation Via Inhaler Every 4 Hours PRN 07/05/21   [provider]  Cholecalciferol (VITAMIN D3) 125 MCG (5000 UT) TABS Take 5,000 Units by mouth daily. 02/14/11   [provider]  Cranberry-Vitamin C-Vitamin E (CRANBERRY PLUS VITAMIN C PO) Take 2 tablets by mouth daily.    [provider]  prenatal vitamin w/FE, FA (PRENATAL 1 + 1) 27-1 MG TABS tablet Take 1 tablet by mouth daily at 12 noon.    [provider]  Probiotic Product (PROBIOTIC PO) Take 1 tablet by mouth daily.    [provider]    Physical Exam Vitals:  Vitals:   06/30/22 1011  BP: 120/80   No LMP recorded. (Menstrual status: IUD).  General: NAD HEENT: normocephalic, anicteric Neck: thyroid slightly enlarged, no nodules palpable.  Genitourinary:  External: Normal external female genitalia.  Normal urethral meatus, normal  Bartholin's and Skene's glands.    Vagina: Normal vaginal mucosa, no evidence of prolapse.    Cervix: Grossly normal in appearance, no bleeding IUD strings visible appear a little longer than usual, cut to about 2-3 cm, IUD device not visible or palpable with bimanual exam.   Uterus: Non-enlarged, mobile, normal contour.  No CMT  Adnexa: ovaries non-enlarged, no adnexal masses  Rectal: deferred  Lymphatic: no evidence of inguinal lymphadenopathy Extremities: no edema, erythema, or tenderness Neurologic: Grossly intact Psychiatric: mood appropriate, affect full  Female chaperone present for pelvic  portions of the physical exam  Assessment: 36 y.o. [id number] IUD check Irregular cycles, excess facial hair, cold intolerance   Plan: Problem List Items Addressed This Visit   None Visit Diagnoses     Excess body and facial hair    -  Primary   Relevant Orders   TSH+Prl+FSH+TestT+LH+DHEA S...   Other fatigue       Relevant Orders   CBC   Cold intolerance         Encouraged pt to schedule appointment with endocrinologist  Will communicate lab results through mychart    Carie Caddy, CNM  Domingo Pulse,  [facility]  06/30/22  10:33 AM

## 2022-07-07 ENCOUNTER — Encounter: Payer: Self-pay | Admitting: Licensed Practical Nurse

## 2022-07-07 LAB — TSH+PRL+FSH+TESTT+LH+DHEA S...
17-Hydroxyprogesterone: 107 ng/dL
Androstenedione: 113 ng/dL (ref 41–262)
DHEA-SO4: 224 ug/dL (ref 57.3–279.2)
FSH: 6.1 m[IU]/mL
LH: 10.1 m[IU]/mL
Prolactin: 5.7 ng/mL (ref 4.8–23.3)
TSH: 0.748 u[IU]/mL (ref 0.450–4.500)
Testosterone, Free: 1.8 pg/mL (ref 0.0–4.2)
Testosterone: 30 ng/dL (ref 8–60)

## 2022-07-07 LAB — CBC
Hematocrit: 41 % (ref 34.0–46.6)
Hemoglobin: 14 g/dL (ref 11.1–15.9)
MCH: 29.9 pg (ref 26.6–33.0)
MCHC: 34.1 g/dL (ref 31.5–35.7)
MCV: 88 fL (ref 79–97)
Platelets: 303 10*3/uL (ref 150–450)
RBC: 4.68 x10E6/uL (ref 3.77–5.28)
RDW: 12.8 % (ref 11.7–15.4)
WBC: 6.9 10*3/uL (ref 3.4–10.8)

## 2022-11-26 ENCOUNTER — Encounter: Payer: Self-pay | Admitting: Licensed Practical Nurse

## 2022-11-26 ENCOUNTER — Ambulatory Visit: Payer: BC Managed Care – PPO | Admitting: Licensed Practical Nurse

## 2022-11-26 VITALS — BP 131/85 | HR 75 | Wt 167.9 lb

## 2022-11-26 DIAGNOSIS — Z3040 Encounter for surveillance of contraceptives, unspecified: Secondary | ICD-10-CM

## 2022-11-26 DIAGNOSIS — Z30432 Encounter for removal of intrauterine contraceptive device: Secondary | ICD-10-CM

## 2022-11-26 MED ORDER — ETONOGESTREL-ETHINYL ESTRADIOL 0.12-0.015 MG/24HR VA RING: VAGINAL_RING | VAGINAL | 12 refills | Status: AC

## 2022-11-26 NOTE — Progress Notes (Signed)
    GYNECOLOGY OFFICE PROCEDURE NOTE  Kaitlin [name] is a 36 y.o. O1Y2482 here for Liletta IUD removal. No GYN concerns.  Last pap smear was on 09/17/2020 and was normal.  She has had pain prior to her cycles. Her cycles are irregular. In the past she has not cycled with a Mirena IUD. Prefers to have it removed. Desires Nuva ring. Denies tobacco use. Denies hx HTN. Would like to use Nuva Ring as continuous use so that she does not not cycle. Cycles effect her mood greatly.   IUD Removal  BP 131/85   Pulse 75   Wt 167 lb 14.4 oz (76.2 kg)   BMI 29.74 kg/m  GEN:NAD   Patient identified, informed consent performed, consent signed.  Patient was in the dorsal lithotomy position, normal external genitalia was noted.  A speculum was placed in the patient's vagina, normal discharge was noted, no lesions. The cervix was visualized, no lesions, no abnormal discharge.  The strings of the IUD were grasped and pulled using ring forceps. The IUD was removed in its entirety. Shown to the pt.  Patient tolerated the procedure well.    Patient will use NuvaRINg  for contraception.  Routine preventative health maintenance measures emphasized.  Pt is transferring her care to the [facility] system has annual exam scheduled there.   BP: reviewed today's reading is at the upper end of normal, advised monitoring BP/see PCP, reviewed increased risks of VTE >35 and elevated BP. Pt aware, will monitor BP    Ellwood Sayers, CNM 11/26/2022

## 2022-11-27 ENCOUNTER — Encounter: Payer: Self-pay | Admitting: Licensed Practical Nurse

## 2023-12-09 HISTORY — PX: EXPLORATORY LAPAROTOMY: SUR591

## 2024-11-01 ENCOUNTER — Ambulatory Visit
Admission: RE | Admit: 2024-11-01 | Discharge: 2024-11-01 | Disposition: A | Discharge status: Home / Self Care | Payer: Self-pay

## 2024-11-01 VITALS — BP 134/84 | HR 93 | Temp 98.0°F | Resp 19

## 2024-11-01 DIAGNOSIS — Z8709 Personal history of other diseases of the respiratory system: Secondary | ICD-10-CM | POA: Diagnosis not present

## 2024-11-01 DIAGNOSIS — J069 Acute upper respiratory infection, unspecified: Secondary | ICD-10-CM | POA: Diagnosis not present

## 2024-11-01 LAB — POC COVID19/FLU A&B COMBO
Covid Antigen, POC: NEGATIVE
Influenza A Antigen, POC: NEGATIVE
Influenza B Antigen, POC: NEGATIVE

## 2024-11-01 MED ORDER — ALBUTEROL SULFATE HFA 108 (90 BASE) MCG/ACT IN AERS: 1.0000 | INHALATION_SPRAY | Freq: Four times a day (QID) | RESPIRATORY_TRACT | 0 refills | Status: AC | PRN

## 2024-11-01 NOTE — Discharge Instructions (Addendum)
 The COVID and flu tests are negative.   Use the albuterol inhaler as directed.    Take Tylenol or ibuprofen as needed for fever or discomfort.  Take plain Mucinex as needed for congestion.  Rest and keep yourself hydrated.    Follow-up with your primary care provider if your symptoms are not improving.

## 2024-11-01 NOTE — ED Triage Notes (Signed)
 Patient to Urgent Care with complaints of nasl congestion/ bilateral ear ache/ intermittently productive cough.  Symptoms started Sunday.   Meds: taking zyrtec/ celebrex

## 2024-11-01 NOTE — ED Provider Notes (Signed)
 CAY RALPH PELT    CSN: [id number] Arrival date & time: 11/01/24  1206      History   Chief Complaint Chief Complaint  Patient presents with   Nasal Congestion    And cough. Ears hurting - Entered by patient    HPI Kaitlin [name] is a 38 y.o. female.  Accompanied by her children, patient presents with 2-day history of ear pain, congestion, cough.  She has been treating her symptoms with Zyrtec and Celebrex.  No fever, shortness of breath, vomiting, diarrhea.  Her medical history includes asthma.  She has not needed to use her albuterol  inhaler and she believes it is expired.  The history is provided by the patient and medical records.    Past Medical History:  Diagnosis Date   Anxiety and depression    Asthma    Eczema    Interstitial cystitis    PCOS (polycystic ovarian syndrome)    Premature labor before 37 weeks of gestation with delivery, delivered    PPROM at 34 weeks    Patient Active Problem List   Diagnosis Date Noted   Thyroid  nodule 04/26/2018   Interstitial cystitis 03/12/2017   Allergic rhinitis 02/26/2015   Anxiety disorder 02/26/2015   Chronic UTI 02/26/2015   Eczema 02/26/2015   Intermittent asthma, uncomplicated 02/26/2015   PCOS (polycystic ovarian syndrome) 02/26/2015   Depression 02/18/2012    Past Surgical History:  Procedure Laterality Date   CESAREAN SECTION N/A 04/15/2021   Procedure: CESAREAN SECTION;  Surgeon: Victor Claudell JONELLE, MD;  Location: [facility] ORS;  Service: Obstetrics;  Laterality: N/A;   Excision of lymph node  02/24/2013   sublingual lymph node: Dr Lum Gaskins   EXPLORATORY LAPAROTOMY N/A 2025   Endometriosis   INTRAUTERINE DEVICE INSERTION  06/01/2017   [facility]   WISDOM TOOTH EXTRACTION      OB History     Gravida  2   Para  2   Term  1   Preterm  1   AB      Living  2      SAB      IAB      Ectopic      Multiple  0   Live Births  2            Home Medications    Prior to  Admission medications   Medication Sig Start Date End Date Taking? Authorizing Provider  albuterol  (VENTOLIN  HFA) 108 (90 Base) MCG/ACT inhaler Inhale 1-2 puffs into the lungs every 6 (six) hours as needed. 11/01/24  Yes Corlis Burnard DEL, NP  busPIRone (BUSPAR) 5 MG tablet Take 5-10 mg by mouth 3 (three) times daily as needed.   Yes [provider]  Cholecalciferol (VITAMIN D3) 125 MCG (5000 UT) TABS Take 5,000 Units by mouth daily. 02/14/11  Yes [provider]  FLUoxetine (PROZAC) 40 MG capsule Take 2 capsules (80 mg total) by mouth once daily 08/12/22  Yes [provider]  acetaminophen  (TYLENOL ) 500 MG tablet Take 500 mg by mouth every 6 (six) hours as needed for mild pain or headache.    [provider]  atomoxetine (STRATTERA) 10 MG capsule Take 20 mg by mouth daily. Patient not taking: Reported on 11/01/2024    [provider]  celecoxib (CELEBREX) 100 MG capsule Take 100 mg by mouth 2 (two) times daily as needed.    [provider]  Cranberry-Vitamin C-Vitamin E (CRANBERRY PLUS VITAMIN C PO) Take 2 tablets by  mouth daily.    [provider]  etonogestrel -ethinyl estradiol  (NUVARING) 0.12-0.015 MG/24HR vaginal ring Insert vaginally and leave in place for 4 consecutive weeks, then insert new ring Patient not taking: Reported on 11/01/2024 11/26/22   Dominic, Jinnie Jansky, CNM  fluticasone  (FLONASE ) 50 MCG/ACT nasal spray PLACE 2 SPRAYS INTO BOTH NOSTRILS ONCE DAILY AS NEEDED FOR RHINITIS Patient not taking: Reported on 11/01/2024    [provider]  levonorgestrel  (LILETTA ) 20.1 MCG/DAY IUD IUD 1 each by Intrauterine route.    [provider]  MAGNESIUM PO Take by mouth.    [provider]  norethindrone (AYGESTIN) 5 MG tablet Take 5 mg by mouth daily.    [provider]  prenatal vitamin w/FE, FA (PRENATAL 1 + 1) 27-1 MG TABS tablet Take 1 tablet by mouth daily at 12 noon.    [provider]   Probiotic Product (PROBIOTIC PO) Take 1 tablet by mouth daily.    [provider]  traZODone (DESYREL) 100 MG tablet Take 100 mg by mouth at bedtime as needed. Patient not taking: Reported on 11/01/2024 08/27/22   [provider]    Family History Family History  Problem Relation Age of Onset   Multiple sclerosis Mother    Breast cancer Maternal Grandmother        69s   Hypertension Maternal Grandfather    Thyroid  cancer Maternal Grandfather    Lung cancer Paternal Grandmother    Hypertension Paternal Grandmother    Diabetes Mellitus II Paternal Grandfather    Hypertension Paternal Grandfather     Social History Social History   Tobacco Use   Smoking status: Former    Current packs/day: 0.00    Types: Cigarettes    Quit date: 2009    Years since quitting: 16.9   Smokeless tobacco: Never  Vaping Use   Vaping status: Never Used  Substance Use Topics   Alcohol use: No   Drug use: No     Allergies   Chocolate flavoring agent (non-screening), Sulfa antibiotics, Sulfamethoxazole-trimethoprim, Latex, and Metronidazole   Review of Systems Review of Systems  Constitutional:  Negative for chills and fever.  HENT:  Positive for congestion and ear pain. Negative for sore throat.   Respiratory:  Positive for cough. Negative for shortness of breath.   Gastrointestinal:  Negative for diarrhea and vomiting.     Physical Exam Triage Vital Signs ED Triage Vitals  Encounter Vitals Group     BP      Girls Systolic BP Percentile      Girls Diastolic BP Percentile      Boys Systolic BP Percentile      Boys Diastolic BP Percentile      Pulse      Resp      Temp      Temp src      SpO2      Weight      Height      Head Circumference      Peak Flow      Pain Score      Pain Loc      Pain Education      Exclude from Growth Chart    No data found.  Updated Vital Signs BP 134/84   Pulse 93   Temp 98 F (36.7 C)   Resp 19   SpO2 99%   Visual  Acuity Right Eye Distance:   Left Eye Distance:   Bilateral Distance:    Right Eye  Near:   Left Eye Near:    Bilateral Near:     Physical Exam Constitutional:      General: She is not in acute distress. HENT:     Right Ear: Tympanic membrane normal.     Left Ear: Tympanic membrane normal.     Nose: Nose normal.     Mouth/Throat:     Mouth: Mucous membranes are moist.     Pharynx: Oropharynx is clear.  Cardiovascular:     Rate and Rhythm: Normal rate and regular rhythm.     Heart sounds: Normal heart sounds.  Pulmonary:     Effort: Pulmonary effort is normal. No respiratory distress.     Breath sounds: Normal breath sounds.  Neurological:     Mental Status: She is alert.      UC Treatments / Results  Labs (all labs ordered are listed, but only abnormal results are displayed) Labs Reviewed  POC COVID19/FLU A&B COMBO - Normal    EKG   Radiology No results found.  Procedures Procedures (including critical care time)  Medications Ordered in UC Medications - No data to display  Initial Impression / Assessment and Plan / UC Course  I have reviewed the triage vital signs and the nursing notes.  Pertinent labs & imaging results that were available during my care of the patient were reviewed by me and considered in my medical decision making (see chart for details).    Viral URI, history of asthma.  Afebrile and vital signs are stable.  Lungs are clear and O2 sat is 99% on room air.  COVID and flu negative.  Refill of albuterol  inhaler sent to pharmacy and instructed patient to use as directed if needed.  Education provided on viral respiratory infection.  Tylenol  or ibuprofen  as needed, plain Mucinex as needed, rest, hydration.  Instructed patient to follow-up with her PCP if she is not improving.  She agrees to plan of care.  Final Clinical Impressions(s) / UC Diagnoses   Final diagnoses:  Viral URI  History of asthma     Discharge Instructions      The  COVID and flu tests are negative.   Use the albuterol  inhaler as directed.  Take Tylenol  or ibuprofen  as needed for fever or discomfort.  Take plain Mucinex as needed for congestion.  Rest and keep yourself hydrated.    Follow-up with your primary care provider if your symptoms are not improving.         ED Prescriptions     Medication Sig Dispense Auth. Provider   albuterol  (VENTOLIN  HFA) 108 (90 Base) MCG/ACT inhaler Inhale 1-2 puffs into the lungs every 6 (six) hours as needed. 18 g Corlis Burnard DEL, NP      PDMP not reviewed this encounter.   Corlis Burnard DEL, NP 11/01/24 1255
